# Patient Record
Sex: Male | Born: 1950
Health system: Southern US, Community
[De-identification: ages and names within clinical notes are randomized; demographics above are authoritative.]

## PROBLEM LIST (undated history)

## (undated) DIAGNOSIS — M1990 Unspecified osteoarthritis, unspecified site: Secondary | ICD-10-CM

## (undated) DIAGNOSIS — I10 Essential (primary) hypertension: Secondary | ICD-10-CM

## (undated) DIAGNOSIS — I219 Acute myocardial infarction, unspecified: Secondary | ICD-10-CM

## (undated) HISTORY — PX: COLONOSCOPY: SHX174

---

## 2007-04-27 ENCOUNTER — Encounter: Payer: Self-pay | Admitting: Emergency Medicine

## 2007-04-27 ENCOUNTER — Ambulatory Visit: Payer: Self-pay | Admitting: Internal Medicine

## 2007-04-28 ENCOUNTER — Inpatient Hospital Stay (HOSPITAL_COMMUNITY): Admission: EM | Admit: 2007-04-28 | Discharge: 2007-04-29 | Payer: Self-pay | Admitting: Internal Medicine

## 2007-04-29 ENCOUNTER — Encounter: Payer: Self-pay | Admitting: Internal Medicine

## 2007-05-22 ENCOUNTER — Ambulatory Visit: Payer: Self-pay | Admitting: Cardiology

## 2010-04-04 ENCOUNTER — Other Ambulatory Visit (HOSPITAL_COMMUNITY): Payer: Self-pay | Admitting: Orthopedic Surgery

## 2010-04-04 ENCOUNTER — Ambulatory Visit (HOSPITAL_COMMUNITY)
Admission: RE | Admit: 2010-04-04 | Discharge: 2010-04-04 | Disposition: A | Discharge status: Home / Self Care | Payer: Managed Care, Other (non HMO) | Source: Ambulatory Visit | Attending: Orthopedic Surgery | Admitting: Orthopedic Surgery

## 2010-04-04 ENCOUNTER — Encounter (HOSPITAL_COMMUNITY)
Admission: RE | Admit: 2010-04-04 | Discharge: 2010-04-04 | Disposition: A | Discharge status: Home / Self Care | Payer: Managed Care, Other (non HMO) | Source: Ambulatory Visit | Attending: Orthopedic Surgery | Admitting: Orthopedic Surgery

## 2010-04-04 DIAGNOSIS — Z01810 Encounter for preprocedural cardiovascular examination: Secondary | ICD-10-CM | POA: Insufficient documentation

## 2010-04-04 DIAGNOSIS — Z01818 Encounter for other preprocedural examination: Secondary | ICD-10-CM | POA: Insufficient documentation

## 2010-04-04 DIAGNOSIS — Z01812 Encounter for preprocedural laboratory examination: Secondary | ICD-10-CM | POA: Insufficient documentation

## 2010-04-04 DIAGNOSIS — Z01811 Encounter for preprocedural respiratory examination: Secondary | ICD-10-CM

## 2010-04-04 LAB — DIFFERENTIAL
Basophils Relative: 0 % (ref 0–1)
Monocytes Absolute: 0.9 10*3/uL (ref 0.1–1.0)
Monocytes Relative: 9 % (ref 3–12)
Neutro Abs: 5.7 10*3/uL (ref 1.7–7.7)

## 2010-04-04 LAB — URINALYSIS, ROUTINE W REFLEX MICROSCOPIC
Glucose, UA: NEGATIVE mg/dL
Hgb urine dipstick: NEGATIVE
Ketones, ur: NEGATIVE mg/dL
Protein, ur: NEGATIVE mg/dL

## 2010-04-04 LAB — CBC
Hemoglobin: 15.3 g/dL (ref 13.0–17.0)
MCH: 32.6 pg (ref 26.0–34.0)
MCHC: 34.2 g/dL (ref 30.0–36.0)

## 2010-04-04 LAB — COMPREHENSIVE METABOLIC PANEL
ALT: 51 U/L (ref 0–53)
Albumin: 4.1 g/dL (ref 3.5–5.2)
Alkaline Phosphatase: 50 U/L (ref 39–117)
Calcium: 9.2 mg/dL (ref 8.4–10.5)
Glucose, Bld: 82 mg/dL (ref 70–99)
Potassium: 4.3 mEq/L (ref 3.5–5.1)
Sodium: 141 mEq/L (ref 135–145)
Total Protein: 7.1 g/dL (ref 6.0–8.3)

## 2010-04-04 LAB — APTT: aPTT: 28 seconds (ref 24–37)

## 2010-04-04 LAB — SURGICAL PCR SCREEN: Staphylococcus aureus: NEGATIVE

## 2010-04-04 LAB — PROTIME-INR
INR: 0.91 (ref 0.00–1.49)
Prothrombin Time: 12.5 seconds (ref 11.6–15.2)

## 2010-04-04 LAB — ABO/RH: ABO/RH(D): O POS

## 2010-04-05 LAB — URINE CULTURE: Culture: NO GROWTH

## 2010-04-10 ENCOUNTER — Inpatient Hospital Stay (HOSPITAL_COMMUNITY)
Admission: RE | Admit: 2010-04-10 | Discharge: 2010-04-12 | DRG: 470 | Disposition: A | Discharge status: Home Health | Payer: Managed Care, Other (non HMO) | Source: Ambulatory Visit | Attending: Orthopedic Surgery | Admitting: Orthopedic Surgery

## 2010-04-10 DIAGNOSIS — M1710 Unilateral primary osteoarthritis, unspecified knee: Principal | ICD-10-CM | POA: Diagnosis present

## 2010-04-10 DIAGNOSIS — D62 Acute posthemorrhagic anemia: Secondary | ICD-10-CM | POA: Diagnosis not present

## 2010-04-11 LAB — CROSSMATCH
ABO/RH(D): O POS
Antibody Screen: NEGATIVE
Unit division: 0
Unit division: 0

## 2010-04-11 LAB — CBC
HCT: 37.9 % — ABNORMAL LOW (ref 39.0–52.0)
Hemoglobin: 12.2 g/dL — ABNORMAL LOW (ref 13.0–17.0)
RBC: 3.86 MIL/uL — ABNORMAL LOW (ref 4.22–5.81)

## 2010-04-11 LAB — BASIC METABOLIC PANEL
CO2: 26 mEq/L (ref 19–32)
Chloride: 104 mEq/L (ref 96–112)
GFR calc Af Amer: >60 mL/min (ref >60)
Glucose, Bld: 106 mg/dL — ABNORMAL HIGH (ref 70–99)
Potassium: 4.1 mEq/L (ref 3.5–5.1)
Sodium: 137 mEq/L (ref 135–145)

## 2010-04-12 LAB — BASIC METABOLIC PANEL
CO2: 25 mEq/L (ref 19–32)
Chloride: 106 mEq/L (ref 96–112)
Creatinine, Ser: 1.6 mg/dL — ABNORMAL HIGH (ref 0.4–1.5)
GFR calc Af Amer: 54 mL/min — ABNORMAL LOW (ref >60)
Glucose, Bld: 100 mg/dL — ABNORMAL HIGH (ref 70–99)

## 2010-04-12 LAB — CBC
HCT: 30.7 % — ABNORMAL LOW (ref 39.0–52.0)
Hemoglobin: 9.9 g/dL — ABNORMAL LOW (ref 13.0–17.0)
MCH: 31.6 pg (ref 26.0–34.0)
MCV: 98.1 fL (ref 78.0–100.0)
RBC: 3.13 MIL/uL — ABNORMAL LOW (ref 4.22–5.81)

## 2010-04-25 NOTE — Op Note (Signed)
NAME:  Robert Bailey, Robert Bailey NO.:  [id number]  MEDICAL RECORD NO.:  [id number]           PATIENT TYPE:  I  LOCATION:  5014                         FACILITY:  [facility]  PHYSICIAN:  Mila Homer. Sherlean Foot, M.D. DATE OF BIRTH:  August 06, 1950  DATE OF PROCEDURE:  04/10/2010 DATE OF DISCHARGE:                              OPERATIVE REPORT   SURGEON:  Mila Homer. Sherlean Foot, MD.  ASSISTANT:  Altamese Cabal, PA-C and Skip Mayer, PA-C  ANESTHESIA:  General.  PREOPERATIVE DIAGNOSIS:  Left knee osteoarthritis.  POSTOPERATIVE DIAGNOSIS:  Left knee osteoarthritis.  PROCEDURE:  Left total knee arthroplasty.  INDICATIONS FOR PROCEDURE:  The patient is a 60 year old white male with failure of conservative measures for osteoarthritis of the knee and a history of an old osteochondral defect pinned with a metal tendon. Informed consent obtained.  DESCRIPTION OF PROCEDURE:  The patient was laid supine and administered general anesthesia.  Left leg prepped and draped in usual sterile fashion.  The extremity was exsanguinated with the Esmarch and tourniquet inflated to 350 mmHg and set for an hour.  Made a midline incision with a #10 blade.  Made a new blade to make a medial parapatellar arthrotomy and performed a synovectomy.  I then performed synovectomy with clean blade and elevated deep MCL off the medial crest of the tibia.  I then everted the patella and measured 24 mm thick.  I reamed down 8-1/2 mm and drilled through lug holes through the 35-mm template.  I recreated 24-mm thickness.  I subluxed patella laterally and went into flexion.  I used extramedullary alignment system on the tibia to make perpendicular cut to the anatomic axis of the tibia.  Used the intramedullary alignment system on the femur to make a 6-degree valgus cut.  Marked out the epicondylar angle and measured 5 degrees.  I then sized to size F and pinned to the 5-degree external rotation holes. I then made the  anterior, posterior, and chamfer cuts with sagittal saw. I did run into the middle panel lateral condyle.  I removed this in pieces.  I did use a straight find and got the entire thing out.  I then placed a lamina spreader in the removed the ACL, PCL, medial lateral menisci, and posterior condylar osteophytes.  I then placed a 12-mm spacer block in the obtained excellent flexion/extension gap balance.  I then finished femur with a size E finishing block and finished the tibia with a size 6 tibial tray drilling keel.  I then trialed with a F femur 6 tibia, 12 insert, 35 patella, had good flexion/extension gap balance dropping angle back to 135 degrees.  Stability was excellent.  Patellar tracking was excellent.  I then removed the trial components, copiously irrigated.  I then cemented the components and removed all excess cement and allowed the cement to harden in extension.  I left a Hemovac to come out superolaterally and deep arthrotomy pain catheter coming out supermedially and superficial arthrotomy.  I let tourniquet down, obtained hemostasis and copiously irrigated.  I then closed the arthrotomy figure-of-eight #1 Vicryl sutures, 0 Vicryl sutures subcuticular, 2-0 Vicryl stitches,  and skin staples.  Dressed with Xeroform dressing, sponges, sterile Webril, and TED stocking.  COMPLICATIONS:  None.  DRAINS:  One Hemovac one pain catheter.  ESTIMATED BLOOD LOSS:  300 mL.  TOURNIQUET TIME:  53 minutes.          ______________________________ Mila Homer Sherlean Foot, M.D.     SDL/MEDQ  D:  04/10/2010  T:  04/11/2010  Job:  [id number]  Electronically Signed by Georgena Spurling M.D. on 04/25/2010 02:18:36 PM

## 2010-05-30 NOTE — Assessment & Plan Note (Signed)
[facility]                          [facility] OFFICE NOTE   MERIT, MAYBEE                      MRN:          [id number]  DATE:05/22/2007                            DOB:          04-Jul-1950    CARDIOLOGIST:  Luis Abed, MD, [facility]   PRIMARY CARE PHYSICIAN:  Dr. Sherryll Burger.   REASON FOR VISIT:  Post hospitalization followup.   HISTORY OF PRESENT ILLNESS:  Robert Bailey is a 60 year old male patient  who was transferred from [facility] to [facility] on  April 28, 2007, with complaints of chest pain.  He ruled out for  myocardial infarction by enzymes.  He underwent cardiac catheterization  that revealed minimal nonobstructive disease except for an occluded  distal circumflex before a small posterolateral artery.  His EF was  normal at 65% with normal wall motion.  He did have a confluence of  vessels arising from the circumflex up toward the atria.  There was some  concern for AV fistula.  He underwent an echocardiogram which revealed  good LV function and no significant valvular abnormalities.  The  coronary sinus did not appear to be enlarged.  There was a tiny unusual  diastolic color jet seen that seemed to be at 1 o'clock on the rim of  the aorta which was separate from the small pulmonic regurgitation jet  seen in that same view.  According to the notes this was not felt to be  clinically significant.   The patient notes he is doing well post hospitalization.  He denies any  femoral arteriotomy site pain.  He denies recurrent any chest pain or  shortness breath.  He denies any syncope or near syncope.  Denies any  orthopnea, PND or pedal edema.  He notes that his blood pressure  medications were adjusted during his hospitalization.  Since his blood  pressure has come down he has felt much better.   CURRENT MEDICATIONS:  1. Aspirin 81 mg daily.  2. Exforge 5/320 mg daily.  3. Simvastatin 40 mg daily at bedtime.  4. Nitroglycerin p.r.n. chest pain.  5. Viagra p.r.n.  6. Benadryl p.r.n. sleep.   ALLERGIES:  PENICILLIN causes side effects.   PHYSICAL EXAM:  GENERAL:  He is a well-nourished, well-developed male in  no acute distress.  VITAL SIGNS:  Blood pressure is 120/92, pulse 83, weight 200.6 pounds.  HEENT:  Is normal without JVD.  Carotid pulse +2.  HEART:  Regular rate and rhythm without murmur.  LUNGS:  Clear to auscultation bilaterally.  ABDOMEN:  Soft, nontender.  EXTREMITIES:  Without edema.  Calves soft, nontender.  SKIN:  Warm and dry.  NEUROLOGIC:  He is alert and oriented x3.  Cranial nerves II-XII grossly  intact.  VASCULAR:  Right femoral arteriotomy site without hematoma or bruit.  Carotids without bruits bilaterally.   Electrocardiogram reveals sinus rhythm with heart rate of 85, normal  axis, no acute changes.   IMPRESSION:  1. Coronary artery disease as outlined above.      a.     Distal circumflex occluded before the small  posterolateral       artery.      b.     Circumflex ostial 25%, RCA mid 25%, PDA mid 30%.  2. Good LV function.  3. Dyslipidemia.  4. Hypertension.  5. Noted confluence of vessels arising from the circumflex upward      toward the atria at cardiac catheterization.      a.     A 2-D echocardiogram without enlargement of the coronary       sinus.      b.     Not felt to be clinically significant.   PLAN:  1. [name] is doing well post catheterization.  His blood      pressure looks much better.  He has had no more chest pain.  He is      being treated appropriately for risk factor modification.  2. He will have his lipid and LFTs rechecked in 3 months to follow up      on his lipids.  3. He will follow up in 6 months or sooner p.r.n.  4. The patient did have an abnormal confluence of vessels arising from      the circumflex at his catheterization.  According to the notes it      was not felt to be significant at discharge.  However, in  the      future we will keep this in mind.  If there is a recurrent question      in the future we could always consider a cardiac CT scan to further      evaluate.      Tereso Newcomer, PA-C  Electronically Signed      Luis Abed, MD, [facility]  Electronically Signed   SW/MedQ  DD: 05/22/2007  DT: 05/22/2007  Job #: [id number]   cc:   Kirstie Peri, MD

## 2010-05-30 NOTE — H&P (Signed)
NAME:  Robert Bailey, Robert Bailey NO.:  [id number]   MEDICAL RECORD NO.:  [id number]          PATIENT TYPE:  INP   LOCATION:  2905                         FACILITY:  [facility]   PHYSICIAN:  Bevelyn Buckles. Bensimhon, MDDATE OF BIRTH:  10-Aug-1950   DATE OF ADMISSION:  04/28/2007  DATE OF DISCHARGE:                              HISTORY & PHYSICAL   PRIMARY CARE PHYSICIAN:  Dr. Clelia Croft in [place].   CARDIOLOGY:  He is new to [facility].   REASON FOR ADMISSION:  Unstable angina.   HISTORY OF PRESENT ILLNESS:  Robert Bailey is a very pleasant 60 year old  male with a history of hypertension and tobacco use.  He denies any  history of known coronary artery disease.  He never had a stress test or  cardiac catheterization.  Today, he was at a family gathering when he  developed substernal chest pain and pressure radiating to his arms.  He  had several these episodes throughout the afternoon which became  increasingly worse.  There was no nausea, vomiting or diaphoresis.  Went  to [facility].  He was markedly hypertensive on arrival.  First  set point care markers were negative.  He was treated with aspirin,  nitroglycerin and Lopressor 5 mg x3 and Lovenox.  Pain was much improved  but he was still with very mild pressure about 1/10.   REVIEW OF SYSTEMS:  Completely negative except for HPI and problem list.   PROBLEM LIST:  1. Hypertension.  2. Osteoarthritis status post left knee surgery.   CURRENT MEDICATIONS:  Diovan 320 mg a day.   ALLERGIES:  PENICILLIN.   SOCIAL HISTORY:  He is married.  He works as an Art gallery manager.  [place] half pack cigarettes a day.  No alcohol.  No drugs.   FAMILY HISTORY:  Mother died from Alzheimer's.  Father died at age 57  due to a cerebral aneurysm.  Brother is 31 and is alive with a history  of stroke at 71.   PHYSICAL EXAMINATION:  GENERAL:  He is in no acute distress.  His  respirations are unlabored.  VITAL SIGNS:  Blood pressure is 160/100 with a heart rate of 70,  saturating 100% on 2 liters nasal cannula.  NECK:  Supple.  No JVD.  Carotid 2+ bilaterally without bruits.  There  is no lymphadenopathy or thyromegaly.  CARDIAC:  PMI is nondisplaced.  He has distant heart sounds.  He is  regular with no murmur, rubs or gallops.  LUNGS: Clear.  ABDOMEN: Soft, nontender, nondistended.  There is no hepatosplenomegaly,  no bruits.  No masses.  Good bowel sounds.  EXTREMITIES:  Warm with no cyanosis, clubbing or edema.  DP pulses 2+ in  the left, 1+ on the right.  There is no rash.  NEUROLOGICAL:  Alert and oriented x3.  Cranial nerves II-XII are intact.  Moves all four extremities without difficulty.  Affect is pleasant.   STUDIES:  EKG shows sinus rhythm rate of 84 with no ST-T wave  abnormalities.   LABORATORY DATA:  White count 8.7, hemoglobin  is 14.3, platelet 227.  Sodium 138, potassium 3.6, BUN is 19, creatinine is 0.98.   ASSESSMENT:  1. Chest pain concerning for unstable angina.  2. Hypertensive hypertension, resistant.  3. Tobacco use.  4. Hypokalemia.   PLAN:  Will admit for rule-out myocardial infarction.  We will control  his blood pressure.  Treat him with Lovenox, aspirin, beta-blocker  statin and nitroglycerin.  Not given Plavix in the possibility of  surgical disease.   He has hypertension.  He is a high risk for an aortic dissection and if  cath is negative, we will pursue a CT to rule out dissection  In fact,  if he has ongoing chest pain, may consider doing this even this evening  prior to his cath.  He may also need a smoking cessation consult.      Bevelyn Buckles. Bensimhon, MD  Electronically Signed     DRB/MEDQ  D:  04/28/2007  T:  04/28/2007  Job:  [id number]

## 2010-05-30 NOTE — Discharge Summary (Signed)
NAME:  Robert Bailey, Robert Bailey NO.:  [id number]   MEDICAL RECORD NO.:  [id number]          PATIENT TYPE:  INP   LOCATION:  4713                         FACILITY:  [facility]   PHYSICIAN:  Robert Rotunda, MD, FACCDATE OF BIRTH:  Jun 04, 1950   DATE OF ADMISSION:  04/28/2007  DATE OF DISCHARGE:  04/29/2007                               DISCHARGE SUMMARY   PRIMARY CARE Robert Bailey:  Dr. Sherryll Bailey in [place].   DISCHARGE DIAGNOSIS:  Chest pain.   SECONDARY DIAGNOSES:  1. Coronary artery disease.  2. Hypertension.  3. Hyperlipidemia.  4. Ongoing tobacco abuse.   ALLERGIES:  PENICILLIN.   PROCEDURES:  Left heart cardiac catheterization and 2D echocardiogram.   HISTORY OF PRESENT ILLNESS:  A 60 year old male without prior history of  coronary artery disease.  The patient was in his usual state of health  until April 28, 2007, when while at a family gathering he developed  substernal chest pain and pressure radiating to his arms.  He had  stuttering symptoms throughout the day and presented at [facility] where he was hypertensive on arrival.  First set point-of-care  markers were negative.  He was treated with aspirin, nitroglycerin, beta  blocker, and Lovenox and transferred to [facility] for evaluation.  The  patient was admitted for rule out.   HOSPITAL COURSE:  Cardiac markers have been negative.  He has had no  additional chest discomfort.  He underwent left heart cardiac  catheterization on April 28, 2007, revealing an occluded distal left  circumflex with otherwise nonobstructive coronary artery disease.  EF  was 65% without regional wall motion abnormalities.  It was noted on  catheterization there was a confluence of vessels arising in the  circumflex upward toward the atria.  Because of this, there was question  of an arteriovenous malformation.  The patient underwent a 2D  echocardiogram to further evaluate the coronary sinus and left atrium  this morning, and this  has shown a normal size of the coronary sinus and  left atrium.  EF was also normal.  A tiny - unusual diastolic color  jet was seen in the short axis view of the aortic valve, which was  separate from a small pulmonary regurgitation jet.  This finding was not  clinically significant.  Robert Bailey will be discharged home today in  good condition.   DISCHARGE LABS:  Hemoglobin 12.9, hematocrit 37.4, WBC 7.8, and  platelets 192.  Sodium 138, potassium 3.6, chloride 107, CO2 of 25, BUN  9, creatinine 0.93, glucose 108, total bilirubin 0.4, alkaline  phosphatase 53, AST 26, ALT 40, total protein 6.0, albumin 3.6, calcium  8.5, hemoglobin A1c 5.6, CK 159, MB 1.8, troponin I of 0.01, total  cholesterol 159, triglycerides 138, HDL 24, and LDL 107.  Urine drug  screen was negative.   DISPOSITION:  The patient is being discharged home today in good  condition.   FOLLOWUP PLANS AND APPOINTMENTS:  We have arranged for followup for him  in [place] with Dr. Willa Bailey on May 22, 2007, at 12:30 p.m.  He is asked  to follow up with Dr. Sherryll Bailey as previously scheduled.   DISCHARGE MEDICATIONS:  1. Aspirin 81 mg daily.  2. Diovan 320 mg.  3. Simvastatin 40 mg nightly.  4. Nitroglycerin 0.4 mg sublingual p.r.n. chest pain.  5. Viagra.  6. Benadryl  7. Over-the-counter sleep aids previously taken as needed.   Patient is advised not to use Viagra if using nitrates.   OUTSTANDING LABORATORY STUDIES:  None.   The patient has been counseled the importance of smoke cessation.   DURATION OF DISCHARGE/ENCOUNTER:  Sixty minutes including physician  time.      Robert Bailey, ANP      Robert Rotunda, MD, [facility]  Electronically Signed    CB/MEDQ  D:  04/29/2007  T:  04/30/2007  Job:  [id number]   cc:   Robert Peri, MD

## 2010-05-30 NOTE — Cardiovascular Report (Signed)
NAME:  Robert Bailey, Robert Bailey NO.:  [id number]   MEDICAL RECORD NO.:  [id number]          PATIENT TYPE:  INP   LOCATION:  4713                         FACILITY:  [facility]   PHYSICIAN:  Rollene Rotunda, MD, FACCDATE OF BIRTH:  07-19-1950   DATE OF PROCEDURE:  DATE OF DISCHARGE:                            CARDIAC CATHETERIZATION   PRIMARY:  Kirstie Peri, MD.   CARDIOLOGIST:  Bevelyn Buckles. Bensimhon, MD.   PROCEDURE:  Left heart catheterization/coronary arteriography.   INDICATIONS:  Evaluate the patient with chest pain.   PROCEDURE NOTE:  Left heart catheterization was performed with the right  femoral artery.  The vessel was cannulated using an anterior wall  puncture.  A #6-French arterial sheath was inserted via the modified  Seldinger technique.  Preformed Judkins pigtail catheter was utilized.  The patient tolerated the procedure well and left the lab in stable  condition.   RESULTS:  Hemodynamics, LV 121/6, AV 120/77.  Coronaries, the LAD  wrapped the apex.  It was normal.  The first diagonal which was large  and normal.  Circumflex had an ostial 25% stenosis.  There was an AV  groove with mid diffuse luminal irregularities.  The distal vessel was  occluded before small posterolateral.  First obtuse marginal was  moderate size and normal.  Second obtuse marginal was moderate size and  normal.  The posterolateral x2 was moderate size and normal.  The right  coronary artery was dominant.  There was mid long 25% stenosis.  The PDA  was moderate size with mid 30% stenosis.   Of note, there was a confluence of vessels arising from the circumflex  upward toward the atria.  This was not seen to empty into any particular  structure.  A small question of AV fistula.  Left ventriculogram, the  left ventriculogram was obtained in the area of the projection.  The EF  65% with normal wall motion.  Aortogram, the distal aortogram was  obtained secondary to some difficulty advancing  the guidewire.  In fact,  we had to do many of the exchanges via wire exchange.  However, there  was no obstructive disease noted though the aorta was somewhat  underfilled.  There may have been some mild plaquing upon which the wire  snagged.  The renal arteries appeared to be normal.   CONCLUSION:  Distal circumflex occlusion.  Mild plaque elsewhere.  Unusual confluence of vessels arising from the circumflex toward the  atria.   PLAN:  We will get an echocardiogram to evaluate left atrial coronary  and sinus size.  The patient will need aggressive primary risk  reduction.      Rollene Rotunda, MD, [facility]  Electronically Signed     JH/MEDQ  D:  04/28/2007  T:  04/29/2007  Job:  [id number]   cc:   Kirstie Peri, MD

## 2010-10-10 LAB — COMPREHENSIVE METABOLIC PANEL
ALT: 40
AST: 26
Albumin: 3.6
Alkaline Phosphatase: 53
Potassium: 3.9
Sodium: 136
Total Protein: 6

## 2010-10-10 LAB — CBC
HCT: 37.4 — ABNORMAL LOW
HCT: 38.9 — ABNORMAL LOW
HCT: 41.1
Hemoglobin: 12.9 — ABNORMAL LOW
Hemoglobin: 14.3
MCHC: 34.8
MCV: 93.6
MCV: 93.9
Platelets: 222
Platelets: 227
RBC: 4 — ABNORMAL LOW
RDW: 13.9
WBC: 7.8
WBC: 8.2

## 2010-10-10 LAB — BASIC METABOLIC PANEL
BUN: 19
BUN: 9
CO2: 25
Calcium: 8.7
Chloride: 107
GFR calc Af Amer: >60
Glucose, Bld: 117 — ABNORMAL HIGH
Potassium: 3.6
Sodium: 138
Sodium: 138

## 2010-10-10 LAB — CARDIAC PANEL(CRET KIN+CKTOT+MB+TROPI)
CK, MB: 2.7
Relative Index: 1.1
Relative Index: 1.3

## 2010-10-10 LAB — URINE DRUGS OF ABUSE SCREEN W ALC, ROUTINE (REF LAB)
Barbiturate Quant, Ur: NEGATIVE
Benzodiazepines.: NEGATIVE
Ethyl Alcohol: <5
Phencyclidine (PCP): NEGATIVE

## 2010-10-10 LAB — DIFFERENTIAL
Basophils Absolute: 0
Basophils Relative: 1
Eosinophils Relative: 3
Monocytes Absolute: 0.8
Neutro Abs: 3.9

## 2010-10-10 LAB — LIPID PANEL
LDL Cholesterol: 107 — ABNORMAL HIGH
Triglycerides: 138

## 2010-10-10 LAB — PROTIME-INR: INR: 1

## 2010-10-10 LAB — APTT: aPTT: 33

## 2011-01-16 HISTORY — PX: REPLACEMENT TOTAL KNEE: SUR1224

## 2015-09-10 ENCOUNTER — Emergency Department (HOSPITAL_COMMUNITY): Payer: Self-pay

## 2015-09-10 ENCOUNTER — Inpatient Hospital Stay (HOSPITAL_COMMUNITY)
Admission: EM | Admit: 2015-09-10 | Discharge: 2015-09-17 | DRG: 300 | Disposition: A | Discharge status: Home / Self Care | Payer: Self-pay | Attending: Surgery | Admitting: Surgery

## 2015-09-10 ENCOUNTER — Encounter (HOSPITAL_COMMUNITY): Payer: Self-pay | Admitting: *Deleted

## 2015-09-10 DIAGNOSIS — I7102 Dissection of abdominal aorta: Secondary | ICD-10-CM

## 2015-09-10 DIAGNOSIS — N179 Acute kidney failure, unspecified: Secondary | ICD-10-CM | POA: Diagnosis not present

## 2015-09-10 DIAGNOSIS — I2510 Atherosclerotic heart disease of native coronary artery without angina pectoris: Secondary | ICD-10-CM | POA: Diagnosis present

## 2015-09-10 DIAGNOSIS — Z9114 Patient's other noncompliance with medication regimen: Secondary | ICD-10-CM

## 2015-09-10 DIAGNOSIS — E669 Obesity, unspecified: Secondary | ICD-10-CM | POA: Diagnosis present

## 2015-09-10 DIAGNOSIS — I71019 Dissection of thoracic aorta, unspecified: Secondary | ICD-10-CM | POA: Diagnosis present

## 2015-09-10 DIAGNOSIS — F17210 Nicotine dependence, cigarettes, uncomplicated: Secondary | ICD-10-CM | POA: Diagnosis present

## 2015-09-10 DIAGNOSIS — Z880 Allergy status to penicillin: Secondary | ICD-10-CM

## 2015-09-10 DIAGNOSIS — I959 Hypotension, unspecified: Secondary | ICD-10-CM | POA: Diagnosis present

## 2015-09-10 DIAGNOSIS — Z6834 Body mass index (BMI) 34.0-34.9, adult: Secondary | ICD-10-CM

## 2015-09-10 DIAGNOSIS — Z8249 Family history of ischemic heart disease and other diseases of the circulatory system: Secondary | ICD-10-CM

## 2015-09-10 DIAGNOSIS — I7103 Dissection of thoracoabdominal aorta: Principal | ICD-10-CM | POA: Diagnosis present

## 2015-09-10 DIAGNOSIS — Z96652 Presence of left artificial knee joint: Secondary | ICD-10-CM | POA: Diagnosis present

## 2015-09-10 DIAGNOSIS — I248 Other forms of acute ischemic heart disease: Secondary | ICD-10-CM | POA: Diagnosis not present

## 2015-09-10 DIAGNOSIS — I10 Essential (primary) hypertension: Secondary | ICD-10-CM | POA: Diagnosis present

## 2015-09-10 DIAGNOSIS — I7101 Dissection of thoracic aorta: Secondary | ICD-10-CM | POA: Diagnosis present

## 2015-09-10 HISTORY — DX: Essential (primary) hypertension: I10

## 2015-09-10 HISTORY — DX: Unspecified osteoarthritis, unspecified site: M19.90

## 2015-09-10 LAB — COMPREHENSIVE METABOLIC PANEL
ALBUMIN: 4.2 g/dL (ref 3.5–5.0)
ALK PHOS: 60 U/L (ref 38–126)
ALT: 29 U/L (ref 17–63)
ANION GAP: 10 (ref 5–15)
AST: 26 U/L (ref 15–41)
BUN: 14 mg/dL (ref 6–20)
CALCIUM: 9.5 mg/dL (ref 8.9–10.3)
CO2: 28 mmol/L (ref 22–32)
CREATININE: 1.58 mg/dL — AB (ref 0.61–1.24)
Chloride: 104 mmol/L (ref 101–111)
GFR calc Af Amer: 52 mL/min — ABNORMAL LOW (ref >60)
GFR calc non Af Amer: 45 mL/min — ABNORMAL LOW (ref >60)
GLUCOSE: 132 mg/dL — AB (ref 65–99)
Potassium: 3.5 mmol/L (ref 3.5–5.1)
SODIUM: 142 mmol/L (ref 135–145)
Total Bilirubin: 1 mg/dL (ref 0.3–1.2)
Total Protein: 7.1 g/dL (ref 6.5–8.1)

## 2015-09-10 LAB — CBC WITH DIFFERENTIAL/PLATELET
BASOS ABS: 0 10*3/uL (ref 0.0–0.1)
Basophils Relative: 0 %
Eosinophils Absolute: 0.2 10*3/uL (ref 0.0–0.7)
Eosinophils Relative: 2 %
HCT: 49.9 % (ref 39.0–52.0)
Hemoglobin: 16.5 g/dL (ref 13.0–17.0)
LYMPHS ABS: 3 10*3/uL (ref 0.7–4.0)
LYMPHS PCT: 32 %
MCH: 31.7 pg (ref 26.0–34.0)
MCHC: 33.1 g/dL (ref 30.0–36.0)
MCV: 96 fL (ref 78.0–100.0)
Monocytes Absolute: 0.7 10*3/uL (ref 0.1–1.0)
Monocytes Relative: 7 %
NEUTROS ABS: 5.4 10*3/uL (ref 1.7–7.7)
NEUTROS PCT: 59 %
PLATELETS: 226 10*3/uL (ref 150–400)
RBC: 5.2 MIL/uL (ref 4.22–5.81)
RDW: 13.5 % (ref 11.5–15.5)
WBC: 9.4 10*3/uL (ref 4.0–10.5)

## 2015-09-10 LAB — TYPE AND SCREEN
ABO/RH(D): O POS
ANTIBODY SCREEN: NEGATIVE

## 2015-09-10 LAB — PROTIME-INR
INR: 0.98
Prothrombin Time: 13 seconds (ref 11.4–15.2)

## 2015-09-10 LAB — I-STAT TROPONIN, ED: Troponin i, poc: 0.01 ng/mL (ref 0.00–0.08)

## 2015-09-10 LAB — MRSA PCR SCREENING: MRSA by PCR: NEGATIVE

## 2015-09-10 LAB — I-STAT CHEM 8, ED
BUN: 18 mg/dL (ref 6–20)
CALCIUM ION: 1.12 mmol/L (ref 1.12–1.23)
CHLORIDE: 101 mmol/L (ref 101–111)
Creatinine, Ser: 1.5 mg/dL — ABNORMAL HIGH (ref 0.61–1.24)
Glucose, Bld: 128 mg/dL — ABNORMAL HIGH (ref 65–99)
HCT: 50 % (ref 39.0–52.0)
Hemoglobin: 17 g/dL (ref 13.0–17.0)
Potassium: 3.4 mmol/L — ABNORMAL LOW (ref 3.5–5.1)
SODIUM: 143 mmol/L (ref 135–145)
TCO2: 26 mmol/L (ref 0–100)

## 2015-09-10 LAB — APTT: aPTT: 26 seconds (ref 24–36)

## 2015-09-10 MED ORDER — FENTANYL CITRATE (PF) 100 MCG/2ML IJ SOLN
50.0000 ug | INTRAMUSCULAR | Status: DC | PRN
  Administered 2015-09-10: 50 ug via INTRAVENOUS
  Filled 2015-09-10: qty 2

## 2015-09-10 MED ORDER — HYDROMORPHONE HCL 1 MG/ML IJ SOLN
1.0000 mg | Freq: Once | INTRAMUSCULAR | Status: AC
  Administered 2015-09-10: 1 mg via INTRAVENOUS
  Filled 2015-09-10: qty 1

## 2015-09-10 MED ORDER — SENNA 8.6 MG PO TABS
1.0000 | ORAL_TABLET | Freq: Two times a day (BID) | ORAL | Status: DC
  Administered 2015-09-10 – 2015-09-17 (×11): 8.6 mg via ORAL
  Filled 2015-09-10 (×13): qty 1

## 2015-09-10 MED ORDER — METOCLOPRAMIDE HCL 5 MG/ML IJ SOLN
5.0000 mg | Freq: Four times a day (QID) | INTRAMUSCULAR | Status: DC
  Administered 2015-09-10 – 2015-09-11 (×2): 5 mg via INTRAVENOUS
  Filled 2015-09-10 (×2): qty 2

## 2015-09-10 MED ORDER — NICARDIPINE HCL IN NACL 20-0.86 MG/200ML-% IV SOLN
3.0000 mg/h | Freq: Once | INTRAVENOUS | Status: AC
  Administered 2015-09-10: 3 mg/h via INTRAVENOUS
  Filled 2015-09-10: qty 200

## 2015-09-10 MED ORDER — FENTANYL CITRATE (PF) 100 MCG/2ML IJ SOLN
INTRAMUSCULAR | Status: AC
  Filled 2015-09-10: qty 2

## 2015-09-10 MED ORDER — SODIUM CHLORIDE 0.9% FLUSH: 3.0000 mL | INTRAVENOUS | Status: DC | PRN

## 2015-09-10 MED ORDER — LABETALOL HCL 5 MG/ML IV SOLN
0.5000 mg/min | INTRAVENOUS | Status: DC
  Administered 2015-09-10: 1.5 mg/min via INTRAVENOUS
  Administered 2015-09-10: 0.5 mg/min via INTRAVENOUS
  Administered 2015-09-10 – 2015-09-11 (×3): 1.5 mg/min via INTRAVENOUS
  Administered 2015-09-12: 2 mg/min via INTRAVENOUS
  Filled 2015-09-10 (×7): qty 100

## 2015-09-10 MED ORDER — NITROPRUSSIDE SODIUM 25 MG/ML IV SOLN
0.0000 ug/kg/min | INTRAVENOUS | Status: DC
  Filled 2015-09-10: qty 2

## 2015-09-10 MED ORDER — ONDANSETRON HCL 4 MG/2ML IJ SOLN
4.0000 mg | Freq: Four times a day (QID) | INTRAMUSCULAR | Status: DC | PRN
  Administered 2015-09-10 (×2): 4 mg via INTRAVENOUS
  Filled 2015-09-10 (×2): qty 2

## 2015-09-10 MED ORDER — NITROPRUSSIDE SODIUM 25 MG/ML IV SOLN
0.0000 ug/kg/min | Freq: Once | INTRAVENOUS | Status: AC
  Administered 2015-09-10: 0.3 ug/kg/min via INTRAVENOUS
  Filled 2015-09-10: qty 2

## 2015-09-10 MED ORDER — FENTANYL CITRATE (PF) 100 MCG/2ML IJ SOLN
INTRAMUSCULAR | Status: AC
  Administered 2015-09-10: 100 ug
  Filled 2015-09-10: qty 2

## 2015-09-10 MED ORDER — OXYCODONE HCL 5 MG PO TABS
5.0000 mg | ORAL_TABLET | ORAL | Status: DC | PRN
  Administered 2015-09-12: 5 mg via ORAL
  Filled 2015-09-10: qty 1

## 2015-09-10 MED ORDER — DOCUSATE SODIUM 100 MG PO CAPS
100.0000 mg | ORAL_CAPSULE | Freq: Two times a day (BID) | ORAL | Status: DC
  Administered 2015-09-10 – 2015-09-17 (×12): 100 mg via ORAL
  Filled 2015-09-10 (×13): qty 1

## 2015-09-10 MED ORDER — IOPAMIDOL (ISOVUE-370) INJECTION 76%
INTRAVENOUS | Status: AC
  Administered 2015-09-10: 100 mL
  Filled 2015-09-10: qty 100

## 2015-09-10 MED ORDER — FENTANYL CITRATE (PF) 100 MCG/2ML IJ SOLN
100.0000 ug | Freq: Once | INTRAMUSCULAR | Status: AC
  Administered 2015-09-10: 100 ug via INTRAVENOUS

## 2015-09-10 MED ORDER — SODIUM CHLORIDE 0.9 % IV SOLN: 250.0000 mL | INTRAVENOUS | Status: DC | PRN

## 2015-09-10 MED ORDER — ESMOLOL HCL-SODIUM CHLORIDE 2000 MG/100ML IV SOLN
25.0000 ug/kg/min | INTRAVENOUS | Status: DC
  Administered 2015-09-10: 25 ug/kg/min via INTRAVENOUS
  Filled 2015-09-10: qty 100

## 2015-09-10 MED ORDER — LABETALOL HCL 5 MG/ML IV SOLN
10.0000 mg | Freq: Once | INTRAVENOUS | Status: AC
  Administered 2015-09-10: 10 mg via INTRAVENOUS
  Filled 2015-09-10: qty 4

## 2015-09-10 MED ORDER — ONDANSETRON HCL 4 MG PO TABS: 4.0000 mg | ORAL_TABLET | Freq: Four times a day (QID) | ORAL | Status: DC | PRN

## 2015-09-10 MED ORDER — SODIUM CHLORIDE 0.9% FLUSH
3.0000 mL | Freq: Two times a day (BID) | INTRAVENOUS | Status: DC
  Administered 2015-09-10 – 2015-09-11 (×3): 3 mL via INTRAVENOUS
  Administered 2015-09-12: 11:00:00 via INTRAVENOUS
  Administered 2015-09-12 – 2015-09-17 (×9): 3 mL via INTRAVENOUS

## 2015-09-10 NOTE — ED Notes (Signed)
PT discomfort increasing.  Pressures increasing.

## 2015-09-10 NOTE — Progress Notes (Signed)
Patient ID: Robert Bailey, male   DOB: 1950/11/06, 65 y.o.   MRN: [id number]  SICU Evening Rounds:  BP under good control today on nipride 0.3 mcg and labetalol 2.5 mg/min.  No chest or back pain today but has had some N/V.  Urine output ok.

## 2015-09-10 NOTE — ED Provider Notes (Signed)
[facility] DEPT Provider Note   CSN: [id number] Arrival date & time: 09/10/15  0705     History   Chief Complaint Chief Complaint  Patient presents with  . Back Pain  . Chest Pain    HPI Robert Bailey is a 65 y.o. male.  HPI  65 year old male who presents with chest pain and back pain. History of poorly controlled type of pressure. Woke up at 5:30 AM with severe chest pain and intrascapular pain. Has not had similar pain before. Says he cannot take a deep breath. It was associated with nausea and diaphoresis. EMS was called subsequently and he was brought to the ED for evaluation. He was reportedly initially hypertensive with systolic blood pressure of 180. Received 2 nitroglycerin tablets with brief hypotension, now resolved. Also received a full dose of aspirin.  History reviewed. No pertinent past medical history.  There are no active problems to display for this patient.   History reviewed. No pertinent surgical history.     Home Medications    Prior to Admission medications   Medication Sig Start Date End Date Taking? Authorizing Provider  aspirin 81 MG tablet Take 81 mg by mouth daily.   Yes Historical Provider, MD    Family History No family history on file.  Social History Social History  Substance Use Topics  . Smoking status: Current Every Day Smoker    Packs/day: 1.00  . Smokeless tobacco: Never Used  . Alcohol use Not on file     Allergies   Penicillins   Review of Systems Review of Systems  Unable to perform ROS: Acuity of condition     Physical Exam Updated Vital Signs BP 154/97   Pulse 74   Resp 13   Ht 5\' 11"  (1.803 m)   Wt 250 lb (113.4 kg)   SpO2 95%   BMI 34.87 kg/m   Physical Exam Physical Exam  Nursing note and vitals reviewed. Constitutional:  Appears to be in significant distress secondary to pain, diaphoretic Head: Normocephalic and atraumatic.  Mouth/Throat: Oropharynx is clear and moist.  Neck: Neck supple.   Cardiovascular: Normal rate and regular rhythm.   no edema. +1 bilateral DP pulses with normal capillary refill. +2 radial pulses bilaterally Pulmonary/Chest: Effort normal and breath sounds normal.  Abdominal: Soft. There is no tenderness. There is no rebound and no guarding.  minimal distention.  Musculoskeletal: No deformities  Neurological: Alert, no facial droop, fluent speech, moves all extremities symmetrically Skin: Skin is warm and dry.  Psychiatric: Cooperative   ED Treatments / Results  Labs (all labs ordered are listed, but only abnormal results are displayed) Labs Reviewed  COMPREHENSIVE METABOLIC PANEL - Abnormal; Notable for the following:       Result Value   Glucose, Bld 132 (*)    Creatinine, Ser 1.58 (*)    GFR calc non Af Amer 45 (*)    GFR calc Af Amer 52 (*)    All other components within normal limits  I-STAT CHEM 8, ED - Abnormal; Notable for the following:    Potassium 3.4 (*)    Creatinine, Ser 1.50 (*)    Glucose, Bld 128 (*)    All other components within normal limits  CBC WITH DIFFERENTIAL/PLATELET  PROTIME-INR  APTT  I-STAT TROPOININ, ED  TYPE AND SCREEN    EKG  EKG Interpretation  Date/Time:  Saturday September 10 2015 07:13:46 EDT Ventricular Rate:  67 PR Interval:    QRS Duration: 100 QT Interval:  446 QTC Calculation: 471 R Axis:   85 Text Interpretation:  Unknown rhythm, irregular rate Borderline right axis deviation Nonspecific repol abnormality, diffuse leads Borderline ST elevation, anterior leads Baseline wander in lead(s) V6 Confirmed by Cleopatra Sardo MD, Page Pucciarelli [id number]) on 09/10/2015 7:51:22 AM       Radiology No results found.  Procedures Procedures (including critical care time)  Medications Ordered in ED Medications  fentaNYL (SUBLIMAZE) 100 MCG/2ML injection (not administered)  esmolol (BREVIBLOC) 2000 mg / 100 mL (20 mg/mL) infusion (not administered)  fentaNYL (SUBLIMAZE) 100 MCG/2ML injection (100 mcg  Given 09/10/15 0715)    iopamidol (ISOVUE-370) 76 % injection (100 mLs  Contrast Given 09/10/15 0730)  fentaNYL (SUBLIMAZE) injection 100 mcg (100 mcg Intravenous Given 09/10/15 0736)  nicardipine (CARDENE) 20mg  in 0.86% saline 245ml IV infusion (0.1 mg/ml) (25.5 mg/hr Intravenous Rate/Dose Change 09/10/15 0818)  HYDROmorphone (DILAUDID) injection 1 mg (1 mg Intravenous Given 09/10/15 0743)  labetalol (NORMODYNE,TRANDATE) injection 10 mg (10 mg Intravenous Given 09/10/15 0758)     Initial Impression / Assessment and Plan / ED Course  I have reviewed the triage vital signs and the nursing notes.  Pertinent labs & imaging results that were available during my care of the patient were reviewed by me and considered in my medical decision making (see chart for details).  Clinical Course   History of poorly controlled blood pressure who presents with sudden onset of severe chest pain and back pain. Is extremely hypertensive with systolic blood pressures greater than 200, with blood pressure differential in the right arm 150s. CT angiogram of the chest, abdomen, and pelvis obtained and concerning for type B aortic dissection. Blood pressure control initiated with nicardipine and esmolol infusions with labetalol IV pushes. Discussed with Dr. Cyndia Bent from cardiothoracic surgery. He will be admitted to ICU for ongoing medical management onto the cardiothoracic surgery service.   CRITICAL CARE Performed by: Forde Dandy  ?  Total critical care time: 45 minutes  Critical care time was exclusive of separately billable procedures and treating other patients.  Critical care was necessary to treat or prevent imminent or life-threatening deterioration.  Critical care was time spent personally by me on the following activities: development of treatment plan with patient and/or surrogate as well as nursing, discussions with consultants, evaluation of patient's response to treatment, examination of patient, obtaining history from  patient or surrogate, ordering and performing treatments and interventions, ordering and review of laboratory studies, ordering and review of radiographic studies, pulse oximetry and re-evaluation of patient's condition.   Final Clinical Impressions(s) / ED Diagnoses   Final diagnoses:  Dissection of thoracoabdominal aorta [facility])    New Prescriptions New Prescriptions   No medications on file     Forde Dandy, MD 09/10/15 1057

## 2015-09-10 NOTE — H&P (Signed)
Robert Bailey [address]       [place] [address]             [phone]      Cardiothoracic Surgery Admission History and Physical   Robert Bailey is an 65 y.o. male.   Chief Complaint:  Chest and back pain, acute type B aortic dissection  HPI:   The patient is a 65 year old gentleman with a history of HTN who has been on medications in the past but says that he stopped them because there were no refills and he could not afford to continue them after he lost his job several years ago. Early this am he developed acute severe chest and back pain and woke his wife up at about 5:30 am. He reports some weakness in his legs at that time that has resolved. He was brought to the ED and had an SBP of 180. He was given 2 SL NTG and full dose ASA with brief hypotension that resolved. An ECG showed sinus rhythm with LVH. Troponin I, poc was 0.01. CTA of the chest shows an acute type B aortic dissection beginning just distal to the left subclavian artery and extending to the level of the celiac axis with complete thrombosis of the false lumen. The descending aorta is mildly enlarged at 3.9 cm. There is no compromise of organ vasculature on CT. He is on Nicardipine drip in the ER and systolic pressure is 539'J with resolution of all pain.   PMH:   HTN  Reports having a cardiac cath several years ago that showed no significant disease after he presented with chest pain.  Denies hyperlipidemia and diabetes   PSH:  S/p left TKR   FH:  Strong family history of hypertension   Social History:  reports that he has been smoking.  He has been smoking about 1.00 pack per day. He has never used smokeless tobacco. Denies alcohol and drug use.  Allergies:  Allergies  Allergen Reactions  . Penicillins Shortness Of Breath     (Not in a hospital admission)  Results for orders placed or performed during the hospital encounter of 09/10/15 (from the past 48 hour(s))  CBC with  Differential     Status: None   Collection Time: 09/10/15  7:20 AM  Result Value Ref Range   WBC 9.4 4.0 - 10.5 K/uL   RBC 5.20 4.22 - 5.81 MIL/uL   Hemoglobin 16.5 13.0 - 17.0 g/dL   HCT 49.9 39.0 - 52.0 %   MCV 96.0 78.0 - 100.0 fL   MCH 31.7 26.0 - 34.0 pg   MCHC 33.1 30.0 - 36.0 g/dL   RDW 13.5 11.5 - 15.5 %   Platelets 226 150 - 400 K/uL   Neutrophils Relative % 59 %   Neutro Abs 5.4 1.7 - 7.7 K/uL   Lymphocytes Relative 32 %   Lymphs Abs 3.0 0.7 - 4.0 K/uL   Monocytes Relative 7 %   Monocytes Absolute 0.7 0.1 - 1.0 K/uL   Eosinophils Relative 2 %   Eosinophils Absolute 0.2 0.0 - 0.7 K/uL   Basophils Relative 0 %   Basophils Absolute 0.0 0.0 - 0.1 K/uL  Comprehensive metabolic panel     Status: Abnormal   Collection Time: 09/10/15  7:20 AM  Result Value Ref Range   Sodium 142 135 - 145 mmol/L   Potassium 3.5 3.5 - 5.1 mmol/L   Chloride 104 101 - 111 mmol/L  CO2 28 22 - 32 mmol/L   Glucose, Bld 132 (H) 65 - 99 mg/dL   BUN 14 6 - 20 mg/dL   Creatinine, Ser 1.58 (H) 0.61 - 1.24 mg/dL   Calcium 9.5 8.9 - 10.3 mg/dL   Total Protein 7.1 6.5 - 8.1 g/dL   Albumin 4.2 3.5 - 5.0 g/dL   AST 26 15 - 41 U/L   ALT 29 17 - 63 U/L   Alkaline Phosphatase 60 38 - 126 U/L   Total Bilirubin 1.0 0.3 - 1.2 mg/dL   GFR calc non Af Amer 45 (L) >60 mL/min   GFR calc Af Amer 52 (L) >60 mL/min    Comment: (NOTE) The eGFR has been calculated using the CKD EPI equation. This calculation has not been validated in all clinical situations. eGFR's persistently <60 mL/min signify possible Chronic Kidney Disease.    Anion gap 10 5 - 15  Protime-INR     Status: None   Collection Time: 09/10/15  7:20 AM  Result Value Ref Range   Prothrombin Time 13.0 11.4 - 15.2 seconds   INR 0.98   APTT     Status: None   Collection Time: 09/10/15  7:20 AM  Result Value Ref Range   aPTT 26 24 - 36 seconds  Type and screen [place]     Status: None   Collection Time: 09/10/15  7:20 AM    Result Value Ref Range   ABO/RH(D) O POS    Antibody Screen NEG    Sample Expiration 09/13/2015   I-Stat Troponin, ED (not at [facility])     Status: None   Collection Time: 09/10/15  7:29 AM  Result Value Ref Range   Troponin i, poc 0.01 0.00 - 0.08 ng/mL   Comment 3            Comment: Due to the release kinetics of cTnI, a negative result within the first hours of the onset of symptoms does not rule out myocardial infarction with certainty. If myocardial infarction is still suspected, repeat the test at appropriate intervals.   I-Stat Chem 8, ED     Status: Abnormal   Collection Time: 09/10/15  7:31 AM  Result Value Ref Range   Sodium 143 135 - 145 mmol/L   Potassium 3.4 (L) 3.5 - 5.1 mmol/L   Chloride 101 101 - 111 mmol/L   BUN 18 6 - 20 mg/dL   Creatinine, Ser 1.50 (H) 0.61 - 1.24 mg/dL   Glucose, Bld 128 (H) 65 - 99 mg/dL   Calcium, Ion 1.12 1.12 - 1.23 mmol/L   TCO2 26 0 - 100 mmol/L   Hemoglobin 17.0 13.0 - 17.0 g/dL   HCT 50.0 39.0 - 52.0 %   Ct Angio Chest/abd/pel For Dissection W And/or Wo Contrast  Result Date: 09/10/2015 CLINICAL DATA:  Dissection study - Hypertension, patient complaining of severe anterior chest pain "patient keeps stating something is wrong" He appears grey and diaphoric EXAM: CT ANGIOGRAPHY CHEST, ABDOMEN, PELVIS WITH CONTRAST TECHNIQUE: Multidetector CT imaging of the chest, abdomen, pelvis was performed using the standard protocol during bolus administration of intravenous contrast. Multiplanar CT image reconstructions and MIPs were obtained to evaluate the vascular anatomy. CONTRAST:  100 cc of isovue 370 mg COMPARISON:  11/11/2010 CT abdomen pelvis FINDINGS: CHEST Vascular: Right arm IV contrast administration. The SVC is patent. Right atrium and right ventricle are nondilated. There is only fair contrast opacification of pulmonary artery branches ; the exam was not  optimized for detection of pulmonary emboli. No large central filling defects are  identified. Patent bilateral pulmonary veins drain into the left atrium. No left atrial appendage thrombus. Good contrast opacification of the thoracic aorta. Ascending aorta is unremarkable. Bovine variant brachiocephalic arterial origin anatomy without proximal stenosis. Just distal to the origin of left subclavian artery begins an area of asymmetric eccentric wall thickening which extends down through the descending thoracic aorta terminating at approximately the level of the celiac axis origin. This associated with displaced intimal calcifications. The regional wall thickening is not hyperdense on the noncontrast scan, and there is no evidence of active flow within this process consistent with either thrombosed dissection or intramural hematoma. There is no compromise of the true lumen. There is mild dilatation of the distal arch to 3.9 cm, proximal descending thoracic aorta 3.4 cm, mid descending 3.9 cm, distal descending 3.8 cm, returning to normal caliber at the level of the celiac axis origin ; the visualized distal descending thoracic aorta was normal in caliber on prior exam. Mediastinum/Lymph Nodes: No masses or pathologically enlarged lymph nodes identified. No pericardial effusion. Lungs/Pleura: Mild dependent atelectasis posteriorly in both lower lobes. Patient motion degrades evaluation through portions of the upper lobes. No focal infiltrate or nodule. No pneumothorax. No pleural effusion. Musculoskeletal: Anterior vertebral endplate spurring at multiple levels in the mid and lower thoracic spine. ABDOMEN Arterial Aorta: The type B dissection or intramural hematoma terminates at approximately level of the celiac axis origin without a involvement of this vessel. There is some scattered atheromatous plaque in the infrarenal abdominal aorta. No aneurysm or stenosis. Celiac axis:          Patent Superior mesenteric:  Patent Left renal:           Patent, single, with some motion degradation. Right renal:           Single, patent Inferior mesenteric: Patent, with origin stenosis related to aortic wall plaque. Left iliac: Eccentric plaque in the common iliac without stenosis or aneurysm, otherwise unremarkable. Right iliac: Short-segment origin stenosis of mild severity, of questionable hemodynamic significance. Scattered plaque in the more distal common iliac and external iliac arteries without aneurysm dissection or stenosis. Venous Venous phase imaging not obtained. Review of the MIP images confirms the above findings. Nonvasular Hepatobiliary: No masses or other significant abnormality. Pancreas: No mass, inflammatory changes, or other significant abnormality. Spleen: Within normal limits in size and appearance. Adrenals/Urinary Tract: No masses identified. No evidence of hydronephrosis. Stomach/Bowel: No evidence of obstruction, inflammatory process, or abnormal fluid collections. Lymphatic: some prominent nodes centrally in the mesenteric with regional wispy changes, similar to what was seen on the prior study. No new adenopathy. Reproductive: Mild prostatic enlargement with central coarse calcifications. Other: No ascites. No free air. Umbilical hernia containing only mesenteric fat. Musculoskeletal: Multilevel lumbar spondylitic changes most marked L4-5 and L5-S1. Negative for fracture. IMPRESSION: 1. Thrombosed Stanford B/DeBakey III dissection versus intramural hematoma in the thoracic aorta extending from just beyond the subclavian artery origin to the level of the celiac axis, as detailed above. Critical Value/emergent results were called by telephone at the time of interpretation on 09/10/2015 at 8:30 am to Dr. Brantley Stage , who verbally acknowledged these results. 2. Mild aneurysmal dilatation of the descending thoracic aorta to 3.9 cm. 3. Mild origin stenosis of the right common iliac artery, of possible hemodynamic significance. Correlate with clinical symptomatology and ABIs. Electronically Signed   By: Lucrezia Europe M.D.   On: 09/10/2015 08:32  Review of Systems  Constitutional: Negative for chills, fever, malaise/fatigue and weight loss.  HENT: Negative.   Eyes: Negative.   Respiratory: Negative for cough, sputum production and shortness of breath.   Cardiovascular: Positive for chest pain. Negative for palpitations, orthopnea, claudication, leg swelling and PND.  Gastrointestinal: Positive for nausea. Negative for abdominal pain, blood in stool, melena and vomiting.  Genitourinary: Negative.   Musculoskeletal: Positive for back pain.  Skin: Negative.   Neurological: Negative for dizziness, sensory change and loss of consciousness.       Weakness in legs when pain started that resolved.  Endo/Heme/Allergies: Negative.   Psychiatric/Behavioral: Negative.     Blood pressure 154/97, pulse 74, resp. rate 13, height _0  (1.803 m), weight 104.3 kg (230 lb), SpO2 95 %. Physical Exam  Constitutional: He is oriented to person, place, and time.  Obese gentleman in no distress  HENT:  Head: Normocephalic and atraumatic.  Mouth/Throat: Oropharynx is clear and moist.  Eyes: EOM are normal. Pupils are equal, round, and reactive to light.  Neck: Normal range of motion. Neck supple. No JVD present.  Normal carotid pulses, no bruits  Cardiovascular: Normal rate, regular rhythm, normal heart sounds and intact distal pulses.   No murmur heard. Respiratory: Effort normal and breath sounds normal. No respiratory distress.  GI: Soft. Bowel sounds are normal. He exhibits no distension and no mass. There is no tenderness.  Musculoskeletal: Normal range of motion. He exhibits no edema.  Lymphadenopathy:    He has no cervical adenopathy.  Neurological: He is alert and oriented to person, place, and time. He has normal strength. No cranial nerve deficit or sensory deficit.  Skin: Skin is warm and dry.  Psychiatric: He has a normal mood and affect.    Study Result   CLINICAL DATA:  Dissection study -  Hypertension, patient complaining of severe anterior chest pain "patient keeps stating something is wrong" He appears grey and diaphoric  EXAM: CT ANGIOGRAPHY CHEST, ABDOMEN, PELVIS WITH CONTRAST  TECHNIQUE: Multidetector CT imaging of the chest, abdomen, pelvis was performed using the standard protocol during bolus administration of intravenous contrast. Multiplanar CT image reconstructions and MIPs were obtained to evaluate the vascular anatomy.  CONTRAST:  100 cc of isovue 370 mg  COMPARISON:  11/11/2010 CT abdomen pelvis  FINDINGS: CHEST  Vascular: Right arm IV contrast administration. The SVC is patent. Right atrium and right ventricle are nondilated. There is only fair contrast opacification of pulmonary artery branches ; the exam was not optimized for detection of pulmonary emboli. No large central filling defects are identified. Patent bilateral pulmonary veins drain into the left atrium. No left atrial appendage thrombus. Good contrast opacification of the thoracic aorta. Ascending aorta is unremarkable. Bovine variant brachiocephalic arterial origin anatomy without proximal stenosis.  Just distal to the origin of left subclavian artery begins an area of asymmetric eccentric wall thickening which extends down through the descending thoracic aorta terminating at approximately the level of the celiac axis origin. This associated with displaced intimal calcifications. The regional wall thickening is not hyperdense on the noncontrast scan, and there is no evidence of active flow within this process consistent with either thrombosed dissection or intramural hematoma. There is no compromise of the true lumen. There is mild dilatation of the distal arch to 3.9 cm, proximal descending thoracic aorta 3.4 cm, mid descending 3.9 cm, distal descending 3.8 cm, returning to normal caliber at the level of the celiac axis origin ; the visualized distal descending thoracic  aorta was normal in caliber on prior exam.  Mediastinum/Lymph Nodes: No masses or pathologically enlarged lymph nodes identified. No pericardial effusion.  Lungs/Pleura: Mild dependent atelectasis posteriorly in both lower lobes. Patient motion degrades evaluation through portions of the upper lobes. No focal infiltrate or nodule. No pneumothorax. No pleural effusion.  Musculoskeletal: Anterior vertebral endplate spurring at multiple levels in the mid and lower thoracic spine.  ABDOMEN  Arterial  Aorta: The type B dissection or intramural hematoma terminates at approximately level of the celiac axis origin without a involvement of this vessel. There is some scattered atheromatous plaque in the infrarenal abdominal aorta. No aneurysm or stenosis.  Celiac axis:          Patent  Superior mesenteric:  Patent  Left renal:           Patent, single, with some motion degradation.  Right renal:          Single, patent  Inferior mesenteric: Patent, with origin stenosis related to aortic wall plaque.  Left iliac: Eccentric plaque in the common iliac without stenosis or aneurysm, otherwise unremarkable.  Right iliac: Short-segment origin stenosis of mild severity, of questionable hemodynamic significance. Scattered plaque in the more distal common iliac and external iliac arteries without aneurysm dissection or stenosis.  Venous  Venous phase imaging not obtained.  Review of the MIP images confirms the above findings.  Nonvasular  Hepatobiliary: No masses or other significant abnormality.  Pancreas: No mass, inflammatory changes, or other significant abnormality.  Spleen: Within normal limits in size and appearance.  Adrenals/Urinary Tract: No masses identified. No evidence of hydronephrosis.  Stomach/Bowel: No evidence of obstruction, inflammatory process, or abnormal fluid collections.  Lymphatic: some prominent nodes centrally in the  mesenteric with regional wispy changes, similar to what was seen on the prior study. No new adenopathy.  Reproductive: Mild prostatic enlargement with central coarse calcifications.  Other: No ascites. No free air. Umbilical hernia containing only mesenteric fat.  Musculoskeletal: Multilevel lumbar spondylitic changes most marked L4-5 and L5-S1. Negative for fracture.  IMPRESSION: 1. Thrombosed Stanford B/DeBakey III dissection versus intramural hematoma in the thoracic aorta extending from just beyond the subclavian artery origin to the level of the celiac axis, as detailed above. Critical Value/emergent results were called by telephone at the time of interpretation on 09/10/2015 at 8:30 am to Dr. Crista Curb , who verbally acknowledged these results. 2. Mild aneurysmal dilatation of the descending thoracic aorta to 3.9 cm. 3. Mild origin stenosis of the right common iliac artery, of possible hemodynamic significance. Correlate with clinical symptomatology and ABIs.   Electronically Signed   By: Corlis Leak M.D.   On: 09/10/2015 08:32    Assessment/Plan  This gentleman has a history of untreated hypertension due to financial difficulty affording medications and presents with uncontrolled severe hypertension and acute type B aortic dissection with complete thrombosis of the false lumen. He had initial weakness in his legs that resolved that could have been due to some alteration in spinal cord blood flow with thrombosis of the false lumen or may have just been due to the pain but he is neurologically intact now. His pain has resolved in the ER although his BP is still too high. He will be admitted to the SICU for observation and BP control with labetalol and nipride. I reviewed the disease process, treatment plans and goals, possible short term and long term complications including the significant risk of death with the patient and his wife and son at  the bedside. They seem to  understand and agree with the treatment plan. I stressed the importance of risk factor reduction including smoking cessation, good long term BP control with medications, dietary changes, exercise and weight control. Will check his Hgb A1c and cholesterol profiles.  Gaye Pollack, MD 09/10/2015, 8:46 AM

## 2015-09-10 NOTE — ED Triage Notes (Signed)
Patient came in today by [facility] with c/o severe back pain and chest pain. Patient woke up with it this morning. Hx of smoking and HTN. BP is high.

## 2015-09-10 NOTE — ED Notes (Addendum)
Cardio thoracic surgeon, Dr Cyndia Bent, at bedside.

## 2015-09-10 NOTE — ED Notes (Signed)
Family at bedside. 

## 2015-09-11 LAB — COMPREHENSIVE METABOLIC PANEL
ALT: 22 U/L (ref 17–63)
ANION GAP: 7 (ref 5–15)
AST: 20 U/L (ref 15–41)
Albumin: 3.8 g/dL (ref 3.5–5.0)
Alkaline Phosphatase: 41 U/L (ref 38–126)
BUN: 20 mg/dL (ref 6–20)
CHLORIDE: 104 mmol/L (ref 101–111)
CO2: 27 mmol/L (ref 22–32)
Calcium: 9 mg/dL (ref 8.9–10.3)
Creatinine, Ser: 1.42 mg/dL — ABNORMAL HIGH (ref 0.61–1.24)
GFR, EST AFRICAN AMERICAN: 59 mL/min — AB (ref >60)
GFR, EST NON AFRICAN AMERICAN: 51 mL/min — AB (ref >60)
Glucose, Bld: 129 mg/dL — ABNORMAL HIGH (ref 65–99)
Potassium: 3.6 mmol/L (ref 3.5–5.1)
SODIUM: 138 mmol/L (ref 135–145)
Total Bilirubin: 1.2 mg/dL (ref 0.3–1.2)
Total Protein: 6.4 g/dL — ABNORMAL LOW (ref 6.5–8.1)

## 2015-09-11 LAB — CBC
HCT: 44.4 % (ref 39.0–52.0)
HEMOGLOBIN: 14.3 g/dL (ref 13.0–17.0)
MCH: 31.2 pg (ref 26.0–34.0)
MCHC: 32.2 g/dL (ref 30.0–36.0)
MCV: 96.7 fL (ref 78.0–100.0)
PLATELETS: 140 10*3/uL — AB (ref 150–400)
RBC: 4.59 MIL/uL (ref 4.22–5.81)
RDW: 13.9 % (ref 11.5–15.5)
WBC: 12.7 10*3/uL — AB (ref 4.0–10.5)

## 2015-09-11 MED ORDER — METOPROLOL TARTRATE 50 MG PO TABS
50.0000 mg | ORAL_TABLET | Freq: Two times a day (BID) | ORAL | Status: DC
  Administered 2015-09-11 – 2015-09-14 (×8): 50 mg via ORAL
  Filled 2015-09-11 (×8): qty 1

## 2015-09-11 NOTE — Progress Notes (Signed)
  Subjective:  No chest or back pain. No further nausea.  Objective: Vital signs in last 24 hours: Temp:  [97.3 F (36.3 C)-98.7 F (37.1 C)] 98.7 F (37.1 C) (08/27 0740) Pulse Rate:  [50-84] 72 (08/27 0930) Cardiac Rhythm: Normal sinus rhythm (08/27 0930) Resp:  [11-29] 23 (08/27 0830) BP: (81-177)/(52-113) 93/59 (08/27 0930) SpO2:  [92 %-99 %] 94 % (08/27 0930) Weight:  [104.3 kg (229 lb 15 oz)] 104.3 kg (229 lb 15 oz) (08/26 1000)  Hemodynamic parameters for last 24 hours:    Intake/Output from previous day: 08/26 0701 - 08/27 0700 In: 657.2 [I.V.:657.2] Out: 800 [Urine:800] Intake/Output this shift: Total I/O In: 67.5 [I.V.:67.5] Out: -   General appearance: alert and cooperative Neurologic: intact Heart: regular rate and rhythm, S1, S2 normal, no murmur, click, rub or gallop Lungs: clear to auscultation bilaterally Abdomen: soft, non-tender; bowel sounds normal; no masses,  no organomegaly Extremities: extremities normal, atraumatic, no cyanosis or edema   Lab Results:  Recent Labs  09/10/15 0720 09/10/15 0731 09/11/15 0234  WBC 9.4  --  12.7*  HGB 16.5 17.0 14.3  HCT 49.9 50.0 44.4  PLT 226  --  140*   BMET:  Recent Labs  09/10/15 0720 09/10/15 0731 09/11/15 0234  NA 142 143 138  K 3.5 3.4* 3.6  CL 104 101 104  CO2 28  --  27  GLUCOSE 132* 128* 129*  BUN 14 18 20   CREATININE 1.58* 1.50* 1.42*  CALCIUM 9.5  --  9.0    PT/INR:  Recent Labs  09/10/15 0720  LABPROT 13.0  INR 0.98   ABG    Component Value Date/Time   TCO2 26 09/10/2015 0731   CBG (last 3)  No results for input(s): GLUCAP in the last 72 hours.  Assessment/Plan:  Acute type B aortic dissection without complications  His BP is under good control on labetalol. Nipride is off. Will transition to Lopressor today. He will not be able to afford po Labetalol. Follow up creat tomorrow. He may need an ACE I in addition to beta blocker if creat stable.  OOB to chair and  mobilize.      LOS: 1 day    Gaye Pollack 09/11/2015

## 2015-09-11 NOTE — Progress Notes (Signed)
Patient ID: Robert Bailey, male   DOB: [birth year], 65 y.o.   MRN: [id number]  SICU Evening Rounds:  BP under good control today on Lopressor. Now off labetalol.  Some difficulty with ambulation today due to balance problems. Unclear if this is related to aortic dissection or just deconditioning and bed rest. Will consult PT.

## 2015-09-12 DIAGNOSIS — I7102 Dissection of abdominal aorta: Secondary | ICD-10-CM

## 2015-09-12 LAB — BASIC METABOLIC PANEL
Anion gap: 10 (ref 5–15)
BUN: 25 mg/dL — AB (ref 6–20)
CHLORIDE: 101 mmol/L (ref 101–111)
CO2: 25 mmol/L (ref 22–32)
Calcium: 9.1 mg/dL (ref 8.9–10.3)
Creatinine, Ser: 1.44 mg/dL — ABNORMAL HIGH (ref 0.61–1.24)
GFR calc Af Amer: 58 mL/min — ABNORMAL LOW (ref >60)
GFR calc non Af Amer: 50 mL/min — ABNORMAL LOW (ref >60)
GLUCOSE: 119 mg/dL — AB (ref 65–99)
POTASSIUM: 3.3 mmol/L — AB (ref 3.5–5.1)
Sodium: 136 mmol/L (ref 135–145)

## 2015-09-12 LAB — HEMOGLOBIN A1C
Hgb A1c MFr Bld: 5.5 % (ref 4.8–5.6)
Mean Plasma Glucose: 111 mg/dL

## 2015-09-12 MED ORDER — POTASSIUM CHLORIDE CRYS ER 20 MEQ PO TBCR
40.0000 meq | EXTENDED_RELEASE_TABLET | Freq: Two times a day (BID) | ORAL | Status: AC
  Administered 2015-09-12 (×2): 40 meq via ORAL
  Filled 2015-09-12 (×2): qty 2

## 2015-09-12 NOTE — Progress Notes (Signed)
Patient refuses to use call bell/inform staff when he wants to ambulate. Patient stated, "To hell with you... To hell with the call bell." Chair and bed alarm are on. Will continue to educate and monitor patient.   Sydnee Cabal. Lovena Le RN

## 2015-09-12 NOTE — Progress Notes (Signed)
Patient ID: Robert Bailey, male   DOB: 1950/07/27, 65 y.o.   MRN: [id number] EVENING ROUNDS NOTE :     [address]       [place] [address]             [phone]                     Total Length of Stay:  LOS: 2 days  BP (!) 156/80   Pulse 83   Temp 99.3 F (37.4 C) (Oral)   Resp 18   Ht 5\' 11"  (1.803 m)   Wt 229 lb 15 oz (104.3 kg)   SpO2 96%   BMI 32.07 kg/m   .Intake/Output      08/27 0701 - 08/28 0700 08/28 0701 - 08/29 0700   P.O. 450 360   I.V. (mL/kg) 285.4 (2.7) 3 (0)   Other 10    Total Intake(mL/kg) 745.4 (7.1) 363 (3.5)   Urine (mL/kg/hr) 1050 (0.4) 425 (0.4)   Total Output 1050 425   Net -304.6 -62        Urine Occurrence  1 x         Lab Results  Component Value Date   WBC 12.7 (H) 09/11/2015   HGB 14.3 09/11/2015   HCT 44.4 09/11/2015   PLT 140 (L) 09/11/2015   GLUCOSE 119 (H) 09/12/2015   CHOL  04/28/2007    159        ATP III CLASSIFICATION:  <200     mg/dL   Desirable  200-239  mg/dL   Borderline High  >=240    mg/dL   High   TRIG 138 04/28/2007   HDL 24 (L) 04/28/2007   LDLCALC (H) 04/28/2007    107        Total Cholesterol/HDL:CHD Risk Coronary Heart Disease Risk Table                     Men   Women  1/2 Average Risk   3.4   3.3   ALT 22 09/11/2015   AST 20 09/11/2015   NA 136 09/12/2015   K 3.3 (L) 09/12/2015   CL 101 09/12/2015   CREATININE 1.44 (H) 09/12/2015   BUN 25 (H) 09/12/2015   CO2 25 09/12/2015   INR 0.98 09/10/2015   HGBA1C 5.5 09/11/2015   Patient not cooperative with care , pulled off wires and leads   Grace Isaac MD  [place] Office [phone] 09/12/2015 6:32 PM

## 2015-09-12 NOTE — Progress Notes (Signed)
  Subjective:  Only complaint is uncomfortable bed. No chest or back pain  Objective: Vital signs in last 24 hours: Temp:  [98.2 F (36.8 C)-99.7 F (37.6 C)] 98.5 F (36.9 C) (08/28 0400) Pulse Rate:  [60-78] 70 (08/28 0700) Cardiac Rhythm: Normal sinus rhythm (08/28 0600) Resp:  [11-26] 22 (08/28 0700) BP: (69-150)/(44-92) 111/60 (08/28 0700) SpO2:  [92 %-98 %] 96 % (08/28 0700)  Hemodynamic parameters for last 24 hours:    Intake/Output from previous day: 08/27 0701 - 08/28 0700 In: 745.4 [P.O.:450; I.V.:285.4] Out: 1050 [Urine:1050] Intake/Output this shift: No intake/output data recorded.  General appearance: alert and cooperative Neurologic: intact Heart: regular rate and rhythm, S1, S2 normal, no murmur, click, rub or gallop Lungs: clear to auscultation bilaterally Abdomen: soft, non-tender; bowel sounds normal; no masses,  no organomegaly Extremities: extremities normal, atraumatic, no cyanosis or edema  Radial and pedal pulses strong bilat  Lab Results:  Recent Labs  09/10/15 0720 09/10/15 0731 09/11/15 0234  WBC 9.4  --  12.7*  HGB 16.5 17.0 14.3  HCT 49.9 50.0 44.4  PLT 226  --  140*   BMET:  Recent Labs  09/11/15 0234 09/12/15 0558  NA 138 136  K 3.6 3.3*  CL 104 101  CO2 27 25  GLUCOSE 129* 119*  BUN 20 25*  CREATININE 1.42* 1.44*  CALCIUM 9.0 9.1    PT/INR:  Recent Labs  09/10/15 0720  LABPROT 13.0  INR 0.98   ABG    Component Value Date/Time   TCO2 26 09/10/2015 0731   CBG (last 3)  No results for input(s): GLUCAP in the last 72 hours.  Assessment/Plan:  BP under good control on Lopressor. Will try to keep SBP less than 140 now. He will probably need an ACE I if creat remains stable but will hold off today.   See how he gets up this am and if he can ambulate will move to 2W. PT consult requested.   LOS: 2 days    Gaye Pollack 09/12/2015

## 2015-09-12 NOTE — Plan of Care (Signed)
Problem: Activity: Goal: Risk for activity intolerance will decrease Outcome: Not Progressing Pt got dizzy upon standing to walk to bathroom and promptly had to sit down. Morning walk postponed.

## 2015-09-12 NOTE — Progress Notes (Addendum)
Pt given new floor bed and bathed. All personal needs met. Denies pain and pleased with setup. Educated on call-bell and says he will try to remember to use it. Bed alarm on.  Will continue to educate and monitor patient.

## 2015-09-12 NOTE — Evaluation (Signed)
Physical Therapy Evaluation Patient Details Name: Robert Bailey MRN: [id number] DOB: [birth year] Today's Date: 09/12/2015   History of Present Illness  65 yo admitted with back and stomach pain with aortic dissection PMHx: HTN, Lt TKA, smoker  Clinical Impression  Pt pleasant and moving well although noted deficit with balance and cognition. Pt reports fatigue unaware of day of week or when he was admitted. Pt with decreased functional mobility and high level balance who will benefit from acute therapy to maximize mobility, independence and balance to return to PLOF.   BP 131/62 before , 138/80 after gait HR 60-70 sats 98% on RA    Follow Up Recommendations No PT follow up    Equipment Recommendations  None recommended by PT    Recommendations for Other Services       Precautions / Restrictions Precautions Precautions: Fall Precaution Comments: sBP <140      Mobility  Bed Mobility Overal bed mobility: Needs Assistance Bed Mobility: Supine to Sit     Supine to sit: Min assist     General bed mobility comments: pt unable to elevate trunk on his own and required physical assist to rise from surface with cues for sequence  Transfers Overall transfer level: Needs assistance   Transfers: Sit to/from Stand Sit to Stand: Min guard         General transfer comment: guarding for safety as pt with periodic sway to right  Ambulation/Gait Ambulation/Gait assistance: Min guard Ambulation Distance (Feet): 600 Feet Assistive device: None Gait Pattern/deviations: Drifts right/left;Step-through pattern;Decreased stride length   Gait velocity interpretation: Below normal speed for age/gender General Gait Details: pt easily distracted by environment, tends to stay near guard rail on right as well as drift to right, guarding for safety and balance  Stairs            Wheelchair Mobility    Modified Rankin (Stroke Patients Only)       Balance Overall balance  assessment: Needs assistance   Sitting balance-Leahy Scale: Normal       Standing balance-Leahy Scale: Good                               Pertinent Vitals/Pain Pain Assessment: No/denies pain    Home Living Family/patient expects to be discharged to:: Private residence Living Arrangements: Spouse/significant other Available Help at Discharge: Family;Available PRN/intermittently Type of Home: House Home Access: Stairs to enter Entrance Stairs-Rails: None Entrance Stairs-Number of Steps: 4, 4" steps Home Layout: One level Home Equipment: Walker - 2 wheels;Cane - single point      Prior Function Level of Independence: Independent               Hand Dominance        Extremity/Trunk Assessment   Upper Extremity Assessment: Overall WFL for tasks assessed           Lower Extremity Assessment: Overall WFL for tasks assessed      Cervical / Trunk Assessment: Kyphotic  Communication   Communication: No difficulties  Cognition Arousal/Alertness: Awake/alert Behavior During Therapy: WFL for tasks assessed/performed Overall Cognitive Status: Impaired/Different from baseline Area of Impairment: Memory;Orientation Orientation Level: Time   Memory: Decreased short-term memory              General Comments      Exercises        Assessment/Plan    PT Assessment Patient needs continued PT services  PT Diagnosis  Abnormality of gait   PT Problem List Decreased activity tolerance;Decreased balance;Decreased cognition  PT Treatment Interventions Gait training;Stair training;Functional mobility training;Balance training;Patient/family education;Therapeutic activities   PT Goals (Current goals can be found in the Care Plan section) Acute Rehab PT Goals Patient Stated Goal: return to home and yard work PT Goal Formulation: With patient Time For Goal Achievement: 09/19/15 Potential to Achieve Goals: Good    Frequency Min 3X/week   Barriers  to discharge        Co-evaluation               End of Session   Activity Tolerance: Patient tolerated treatment well Patient left: in chair;with call bell/phone within reach;with chair alarm set Nurse Communication: Mobility status         Time: [id number] PT Time Calculation (min) (ACUTE ONLY): 23 min   Charges:   PT Evaluation $PT Eval Moderate Complexity: 1 Procedure     PT G CodesMelford Aase 09/12/2015, 12:46 PM Elwyn Reach, [place]

## 2015-09-13 ENCOUNTER — Encounter (HOSPITAL_COMMUNITY): Payer: Self-pay | Admitting: Student

## 2015-09-13 DIAGNOSIS — I10 Essential (primary) hypertension: Secondary | ICD-10-CM

## 2015-09-13 DIAGNOSIS — R0789 Other chest pain: Secondary | ICD-10-CM

## 2015-09-13 DIAGNOSIS — Z720 Tobacco use: Secondary | ICD-10-CM

## 2015-09-13 DIAGNOSIS — I7101 Dissection of thoracic aorta: Secondary | ICD-10-CM

## 2015-09-13 LAB — TROPONIN I
TROPONIN I: 0.05 ng/mL — AB (ref <0.03)
Troponin I: 0.05 ng/mL (ref <0.03)

## 2015-09-13 LAB — BASIC METABOLIC PANEL
Anion gap: 6 (ref 5–15)
BUN: 21 mg/dL — AB (ref 6–20)
CHLORIDE: 106 mmol/L (ref 101–111)
CO2: 25 mmol/L (ref 22–32)
CREATININE: 1.2 mg/dL (ref 0.61–1.24)
Calcium: 9 mg/dL (ref 8.9–10.3)
Glucose, Bld: 112 mg/dL — ABNORMAL HIGH (ref 65–99)
Potassium: 4 mmol/L (ref 3.5–5.1)
SODIUM: 137 mmol/L (ref 135–145)

## 2015-09-13 MED ORDER — MORPHINE SULFATE (PF) 2 MG/ML IV SOLN
INTRAVENOUS | Status: AC
  Filled 2015-09-13: qty 1

## 2015-09-13 MED ORDER — LABETALOL HCL 5 MG/ML IV SOLN
0.5000 mg/min | INTRAVENOUS | Status: DC
  Administered 2015-09-13: 2.5 mg/min via INTRAVENOUS
  Administered 2015-09-13: 0.2 mg/min via INTRAVENOUS
  Administered 2015-09-13 – 2015-09-14 (×2): 1 mg/min via INTRAVENOUS
  Filled 2015-09-13 (×5): qty 100

## 2015-09-13 MED ORDER — MORPHINE SULFATE (PF) 2 MG/ML IV SOLN
2.0000 mg | Freq: Once | INTRAVENOUS | Status: AC
  Administered 2015-09-13: 2 mg via INTRAVENOUS

## 2015-09-13 MED ORDER — LISINOPRIL 20 MG PO TABS
20.0000 mg | ORAL_TABLET | Freq: Every day | ORAL | Status: DC
  Administered 2015-09-13: 20 mg via ORAL
  Filled 2015-09-13: qty 1

## 2015-09-13 NOTE — Progress Notes (Signed)
CRITICAL VALUE ALERT  Critical value received:  Troponin I        0.05  Date of notification:  09/13/15  Time of notification:  [id number]  Critical value read back:yes  Nurse who received alert:  Beckie Salts  MD notified (1st page):  Bartle  Time of first page:  1420  MD notified (2nd page):  Time of second page:  Responding MD:  Cyndia Bent  Time MD responded:  [id number]

## 2015-09-13 NOTE — Plan of Care (Signed)
Problem: Safety: Goal: Ability to remain free from injury will improve Outcome: Progressing Pt still very impulsive, trying to get out of bed and sometimes pulling at lines and tubing. Bed and chair alarm in use.  Problem: Pain Managment: Goal: General experience of comfort will improve Outcome: Completed/Met Date Met: 09/13/15 Patient has no complaints of pain the last 12 hours.

## 2015-09-13 NOTE — Progress Notes (Signed)
  Subjective:  No complaints this am. He was put back on labetolol drip last night due to hypertension after being off it all day yesterday. No chest or back pain.  Objective: Vital signs in last 24 hours: Temp:  [98.3 F (36.8 C)-99.3 F (37.4 C)] 98.3 F (36.8 C) (08/29 0725) Pulse Rate:  [55-83] 78 (08/29 0000) Cardiac Rhythm: Normal sinus rhythm (08/29 0600) Resp:  [11-29] 25 (08/29 0700) BP: (101-178)/(60-109) 123/82 (08/29 0700) SpO2:  [93 %-100 %] 95 % (08/29 0400)  Hemodynamic parameters for last 24 hours:    Intake/Output from previous day: 08/28 0701 - 08/29 0700 In: 549.5 [P.O.:360; I.V.:189.5] Out: 650 [Urine:650] Intake/Output this shift: No intake/output data recorded.  General appearance: alert and cooperative Neurologic: intact Heart: regular rate and rhythm, S1, S2 normal, no murmur, click, rub or gallop Lungs: clear to auscultation bilaterally Abdomen: soft, non-tender; bowel sounds normal; no masses,  no organomegaly Extremities: extremities normal, atraumatic, no cyanosis or edema  Femoral and pedal pulses palpable bilat and strong  Lab Results:  Recent Labs  09/11/15 0234  WBC 12.7*  HGB 14.3  HCT 44.4  PLT 140*   BMET:  Recent Labs  09/12/15 0558 09/13/15 0200  NA 136 137  K 3.3* 4.0  CL 101 106  CO2 25 25  GLUCOSE 119* 112*  BUN 25* 21*  CREATININE 1.44* 1.20  CALCIUM 9.1 9.0    PT/INR: No results for input(s): LABPROT, INR in the last 72 hours. ABG    Component Value Date/Time   TCO2 26 09/10/2015 0731   CBG (last 3)  No results for input(s): GLUCAP in the last 72 hours.  Assessment/Plan:  Uncomplicated acute type B aortic dissection due to uncontrolled HTN. His creat is down so will add Lisinopril to Lopressor and wean off Labetalol.   Continue mobilization, IS.   LOS: 3 days    Gaye Pollack 09/13/2015

## 2015-09-13 NOTE — Progress Notes (Signed)
[facility] BRIEF SICU PROGRESS NOTE  Stable day  Plan: Continue current plan  Rexene Alberts, MD 09/13/2015 5:24 PM

## 2015-09-13 NOTE — Care Management Note (Signed)
Case Management Note  Patient Details  Name: Robert Bailey MRN: [id number] Date of Birth: December 17, 1950  Subjective/Objective:   CM met with pt and wife @ bedside.  Pt has no health insurance, wife works but cannot afford health insurance through her job.  Pt was concerned he would not be able to afford medication - advised him that lisinopril and metoprolol are both on [facility] generic list and each script will cost $4.00 for 30-day supply or $10 for 90-day supply.  Pt relieved that meds will be affordable.                   Expected Discharge Plan:  Home/Self Care  Discharge planning Services  CM Consult  Status of Service:  In process, will continue to follow  Girard Cooter, RN 09/13/2015, 2:07 PM

## 2015-09-13 NOTE — Progress Notes (Addendum)
Dr. Servando Snare notified of BP up to 160s while in bed with eyes closed. Pt fidgety and restless in bed, as he has been tonight and last night, however denies any pain and has no PRNs for BP ordered. Verbal orders received to restart labetalol drip to maintain BP < 140 per Dr. Vivi Martens previous note. Will initiate orders and continue to monitor patient.

## 2015-09-13 NOTE — Consult Note (Signed)
Cardiology Consult    Patient ID: Robert Bailey MRN: [id number], DOB/AGE: 03-02-1950   Admit date: 09/10/2015 Date of Consult: 09/13/2015  Primary Physician: No primary care provider on file. Reason for Consult: Chest Pain Primary Cardiologist: Previously Dr. Ron Parker in 2009 Requesting Provider: Dr. Cyndia Bent   History of Present Illness    Robert Bailey is a 65 y.o. male with past medical history of CAD, HTN, tobacco use, and arthritis who presented to [facility] ED on 09/10/2015 for evaluation of sudden onset back and chest pain.   Patient reports he woke up that morning with severe pain in his chest and left scapular area, saying he felt as if he could not take a deep breath. SBP was elevated into the 180's upon arrival to the ED. 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 Mild aneurysmal dilatation of the descending thoracic aorta to 3.9 cm and mild origin stenosis of the right common iliac artery, of possible hemodynamic significance was noted as well. Labs showed a creatinine of 1.58. WBC 9.4, Hgb 16.5, and platelets 226.  He was admitted by CT Surgery and initially placed on a Nipride infusion along with Esmolol. These have since been discontinued and he was switched to a Labetalol drip. He has been started on Lopressor 50mg  BID.   Yesterday, he was agitated and frustrated with staff, removing leads and refusing to use the call bell. SBP became elevated into the 160's and his Labetalol drip was restarted, currently going at 0.6mg /min. Current PO medications include Lisinopril 20mg  daily and Lopressor 50mg  BID.   Cardiology is asked to see the patient for chest pain. The patient reports he had one episode of shooting chest pain along his left pectoral region earlier this morning. Says it lasted for only a few seconds then resolved. Not  as intense as his pain on admission. No associated nausea, vomiting, dyspnea, or diaphoresis noted.    EKG was obtained and showed NSR, HR 67, LVH with repol abnormality in anterior leads, TWI in V5 and V6 (no prior tracings available for comparison). Cyclic troponin values are being obtained and the initial one is minimally elevated to 0.05.   His last ischemic evaluation was a cardiac catheterization in 2009 which showed 25% ostial LCx with distal vessel occluded (confluence of vessels not appearing to empty into any structure), normal LAD, OM1, and OM2. RCA with 25% stenosis and PDA with 30% stenosis. Echocardiogram showed EF of 60% with no wall motion abnormalities.   The patient denies any recent anginal symptoms prior to admission. Says he is active in mowing the yard and trimming weeds. Also has recently built a workshop for him and his son.   Reports smoking 1.0 ppd for over 40 years, saying he is now motivated to stop. Denies any excessive alcohol use or recreational drug use.  Past Medical History   Past Medical History:  Diagnosis Date  . Arthritis   . Hypertension     Past Surgical History:  Procedure Laterality Date  . REPLACEMENT TOTAL KNEE Left 2013     Allergies  Allergies  Allergen Reactions  . Penicillins Shortness Of Breath    Inpatient Medications    . docusate sodium  100 mg Oral BID  . lisinopril  20 mg Oral Daily  . metoprolol tartrate  50 mg Oral BID  . senna  1 tablet Oral BID  . sodium chloride flush  3 mL Intravenous Q12H    Family History    Family History  Problem Relation Age of Onset  . Hypertension Mother     Social History    Social History   Social History  . Marital status: Married    Spouse name: Robert Bailey  . Number of children: 2  . Years of education: N/A   Occupational History  . retired    Social History Main Topics  . Smoking status: Current Every Day Smoker    Packs/day: 1.00    Types: Cigarettes  . Smokeless  tobacco: Never Used  . Alcohol use No  . Drug use: No  . Sexual activity: Not on file   Other Topics Concern  . Not on file   Social History Narrative  . No narrative on file     Review of Systems    General:  No chills, fever, night sweats or weight changes.  Cardiovascular:  No dyspnea on exertion, edema, orthopnea, palpitations, paroxysmal nocturnal dyspnea. Positive for chest pain and back pain.  Dermatological: No rash, lesions/masses Respiratory: No cough, dyspnea Urologic: No hematuria, dysuria Abdominal:   No nausea, vomiting, diarrhea, bright red blood per rectum, melena, or hematemesis Neurologic:  No visual changes, wkns, changes in mental status. All other systems reviewed and are otherwise negative except as noted above.  Physical Exam    Blood pressure 125/78, pulse 78, temperature 98.9 F (37.2 C), temperature source Oral, resp. rate (!) 24, height 5\' 11"  (1.803 m), weight 229 lb 15 oz (104.3 kg), SpO2 95 %.  General: Pleasant, Caucasian male appearing in NAD Psych: Normal affect. Neuro: Alert and oriented X 3. Moves all extremities spontaneously. HEENT: Normal  Neck: Supple without bruits or JVD. Lungs:  Resp regular and unlabored, CTA without wheezing or rales. Heart: RRR no s3, s4, or murmurs. Abdomen: Soft, non-tender, non-distended, BS + x 4.  Extremities: No clubbing, cyanosis or edema. DP/PT/Radials 2+ and equal bilaterally.  Labs    Troponin (Point of Care Test) No results for input(s): TROPIPOC in the last 72 hours.  Recent Labs  09/13/15 1312  TROPONINI 0.05*   Lab Results  Component Value Date   WBC 12.7 (H) 09/11/2015   HGB 14.3 09/11/2015   HCT 44.4 09/11/2015   MCV 96.7 09/11/2015   PLT 140 (L) 09/11/2015    Recent Labs Lab 09/11/15 0234  09/13/15 0200  NA 138  < > 137  K 3.6  < > 4.0  CL 104  < > 106  CO2 27  < > 25  BUN 20  < > 21*  CREATININE 1.42*  < > 1.20  CALCIUM 9.0  < > 9.0  PROT 6.4*  --   --   BILITOT 1.2  --    --   ALKPHOS 41  --   --   ALT 22  --   --   AST 20  --   --   GLUCOSE 129*  < > 112*  < > = values in this interval not displayed.   Radiology Studies    Ct Angio Chest/abd/pel For Dissection W And/or Wo Contrast  Result Date: 09/10/2015 CLINICAL DATA:  Dissection study - Hypertension, patient complaining of severe anterior chest pain "patient keeps stating something is wrong" He appears grey and diaphoric EXAM: CT ANGIOGRAPHY CHEST, ABDOMEN, PELVIS WITH CONTRAST TECHNIQUE: Multidetector CT imaging of the chest, abdomen, pelvis was performed using the standard  protocol during bolus administration of intravenous contrast. Multiplanar CT image reconstructions and MIPs were obtained to evaluate the vascular anatomy. CONTRAST:  100 cc of isovue 370 mg COMPARISON:  11/11/2010 CT abdomen pelvis FINDINGS: CHEST Vascular: Right arm IV contrast administration. The SVC is patent. Right atrium and right ventricle are nondilated. There is only fair contrast opacification of pulmonary artery branches ; the exam was not optimized for detection of pulmonary emboli. No large central filling defects are identified. Patent bilateral pulmonary veins drain into the left atrium. No left atrial appendage thrombus. Good contrast opacification of the thoracic aorta. Ascending aorta is unremarkable. Bovine variant brachiocephalic arterial origin anatomy without proximal stenosis. Just distal to the origin of left subclavian artery begins an area of asymmetric eccentric wall thickening which extends down through the descending thoracic aorta terminating at approximately the level of the celiac axis origin. This associated with displaced intimal calcifications. The regional wall thickening is not hyperdense on the noncontrast scan, and there is no evidence of active flow within this process consistent with either thrombosed dissection or intramural hematoma. There is no compromise of the true lumen. There is mild dilatation of the  distal arch to 3.9 cm, proximal descending thoracic aorta 3.4 cm, mid descending 3.9 cm, distal descending 3.8 cm, returning to normal caliber at the level of the celiac axis origin ; the visualized distal descending thoracic aorta was normal in caliber on prior exam. Mediastinum/Lymph Nodes: No masses or pathologically enlarged lymph nodes identified. No pericardial effusion. Lungs/Pleura: Mild dependent atelectasis posteriorly in both lower lobes. Patient motion degrades evaluation through portions of the upper lobes. No focal infiltrate or nodule. No pneumothorax. No pleural effusion. Musculoskeletal: Anterior vertebral endplate spurring at multiple levels in the mid and lower thoracic spine. ABDOMEN Arterial Aorta: The type B dissection or intramural hematoma terminates at approximately level of the celiac axis origin without a involvement of this vessel. There is some scattered atheromatous plaque in the infrarenal abdominal aorta. No aneurysm or stenosis. Celiac axis:          Patent Superior mesenteric:  Patent Left renal:           Patent, single, with some motion degradation. Right renal:          Single, patent Inferior mesenteric: Patent, with origin stenosis related to aortic wall plaque. Left iliac: Eccentric plaque in the common iliac without stenosis or aneurysm, otherwise unremarkable. Right iliac: Short-segment origin stenosis of mild severity, of questionable hemodynamic significance. Scattered plaque in the more distal common iliac and external iliac arteries without aneurysm dissection or stenosis. Venous Venous phase imaging not obtained. Review of the MIP images confirms the above findings. Nonvasular Hepatobiliary: No masses or other significant abnormality. Pancreas: No mass, inflammatory changes, or other significant abnormality. Spleen: Within normal limits in size and appearance. Adrenals/Urinary Tract: No masses identified. No evidence of hydronephrosis. Stomach/Bowel: No evidence of  obstruction, inflammatory process, or abnormal fluid collections. Lymphatic: some prominent nodes centrally in the mesenteric with regional wispy changes, similar to what was seen on the prior study. No new adenopathy. Reproductive: Mild prostatic enlargement with central coarse calcifications. Other: No ascites. No free air. Umbilical hernia containing only mesenteric fat. Musculoskeletal: Multilevel lumbar spondylitic changes most marked L4-5 and L5-S1. Negative for fracture. IMPRESSION: 1. Thrombosed Stanford B/DeBakey III dissection versus intramural hematoma in the thoracic aorta extending from just beyond the subclavian artery origin to the level of the celiac axis, as detailed above. Critical Value/emergent results were called by  telephone at the time of interpretation on 09/10/2015 at 8:30 am to Dr. Brantley Stage , who verbally acknowledged these results. 2. Mild aneurysmal dilatation of the descending thoracic aorta to 3.9 cm. 3. Mild origin stenosis of the right common iliac artery, of possible hemodynamic significance. Correlate with clinical symptomatology and ABIs. Electronically Signed   By: Lucrezia Europe M.D.   On: 09/10/2015 08:32    EKG & Cardiac Imaging    EKG: NSR, HR 67, LVH with repol abnormality in anterior leads, TWI in V5 and V6 (no prior tracings available for comparison).   Cardiac Catheterization: 04/2007 RESULTS:  Hemodynamics, LV 121/6, AV 120/77.  Coronaries, the LAD  wrapped the apex.  It was normal.  The first diagonal which was large  and normal.  Circumflex had an ostial 25% stenosis.  There was an AV  groove with mid diffuse luminal irregularities.  The distal vessel was  occluded before small posterolateral.  First obtuse marginal was  moderate size and normal.  Second obtuse marginal was moderate size and  normal.  The posterolateral x2 was moderate size and normal.  The right  coronary artery was dominant.  There was mid long 25% stenosis.  The PDA  was moderate size with  mid 30% stenosis.   Of note, there was a confluence of vessels arising from the circumflex  upward toward the atria.  This was not seen to empty into any particular  structure.  A small question of AV fistula.  Left ventriculogram, the  left ventriculogram was obtained in the area of the projection.  The EF  65% with normal wall motion.  Aortogram, the distal aortogram was  obtained secondary to some difficulty advancing the guidewire.  In fact,  we had to do many of the exchanges via wire exchange.  However, there  was no obstructive disease noted though the aorta was somewhat  underfilled.  There may have been some mild plaquing upon which the wire  snagged.  The renal arteries appeared to be normal.   CONCLUSION:  Distal circumflex occlusion.  Mild plaque elsewhere.  Unusual confluence of vessels arising from the circumflex toward the  atria.   PLAN:  We will get an echocardiogram to evaluate left atrial coronary  and sinus size.  The patient will need aggressive primary risk  reduction.   Echocardiogram: 04/2007 SUMMARY - Overall left ventricular systolic function was normal. Left    ventricular ejection fraction was estimated to be 60 %. There    were no left ventricular regional wall motion abnormalities. - Normal mitral valve - The coronary sinus does not appear to be enlarged. There is a    tiny unusual diastolic color jet seen. When looking at the    short axis of the aortic valve, this jet seems to be at 1    O'Clock on the rim of the aorta. It is separate from the    small PI jet seen in this view.  IMPRESSIONS - The coronary sinus does not appear to be enlarged. There is a    tiny unusual diastolic color jet seen. When looking at the    short axis of the aortic valve, this jet seems to be at 1    O'Clock on the rim of the aorta. It is separate from the    small PI jet seen in this view.  Assessment & Plan    1.  Atypical Chest Pain - patient reports having one episode of chest discomfort this  AM which lasted for a few seconds this resolved. Denies any associated symptoms and less intense than his initial presenting symptoms. Denies any exertional chest discomfort or dyspnea prior to admission. Performs yard work without any symptoms.  - last ischemic evaluation was a cardiac catheterization in 2009 which showed 25% ostial LCx with distal vessel occluded (confluence of vessels not appearing to empty into any structure), normal LAD, OM1 and OM2, RCA with 25% stenosis and PDA with 30% stenosis.  - EKG today shows NSR, HR 67, LVH with repol abnormality in anterior leads, TWI in V5 and V6 (no prior tracings available for comparison but similar to one on admission). Cyclic troponin values are being obtained and the initial one is minimally elevated to 0.05, likely secondary to demand ischemia in the setting of Type B aortic dissection and accelerated HTN. - continue to cycle values. Will obtain echocardiogram to assess LV function and wall motion.   2. Accelerated HTN - SBP initially in the 180's upon arrival to the ED. Currently on Labetalol drip at 0.6mg /min, PO Lisinopril 20mg  daily and PO Lopressor 50mg  BID.  - BP currently in the 120's/70's. Continue current medication regimen.   3. Type B Aortic Dissection - CTA on admission showed a thrombosed Stanford B/DeBakey III dissection versus intramural hematoma in the thoracic aorta extending from just beyond the subclavian artery origin to the level of the celiac axis. Mild aneurysmal dilatation of the descending thoracic aorta to 3.9 cm and mild origin stenosis of the right common iliac artery, of possible hemodynamic significance was noted as well.  - per CT Surgery.   4. AKI - creatinine elevated top 1.58 on admission, improved to 1.20 this AM.   5. Tobacco Use - cessation advised. Patient says he is determined to quit.   Signed, Erma Heritage,  PA-C 09/13/2015, 3:33 PM Pager: [phone] Patient seen and examined and history reviewed. Agree with above findings and plan. 65 yo WM with history of uncontrolled HTN, tobacco abuse, CAD presents with acute scapular and back pain. CT positive for type B aortic dissection with intramural hematoma. No compromise of side branches. BP controlled with beta blocker and ACEi.  This am patient complained of chest pain. He states it only lasted a few seconds but nursing notes it lasted 20 minutes. Pain now resolved. On exam he is normotensive. Pulse 74 NSR Lungs clear. CV RRR without gallop, murmur or rub. No edema. Peripheral pulses 2+.  Neuro somnolent but intact.  Ecg shows LVH with repolarization abnormality.  Initial troponin 0.05.   Impression.  1. Type B aortic dissection with intramural hematoma.  2. Chest pain with atypical features. 3. HTN 4. Tobacco abuse 5. CAD- mild by cath in 2009  Plan: optimize BP control as you are doing. Will cycle troponin levels. Check serial Ecg. Will check Echo. Counseled on smoking cessation and he is ready to quit. We will follow.  Zandria Woldt [place], [place] 09/13/2015 3:52 PM

## 2015-09-14 ENCOUNTER — Other Ambulatory Visit (HOSPITAL_COMMUNITY): Payer: Managed Care, Other (non HMO)

## 2015-09-14 ENCOUNTER — Inpatient Hospital Stay (HOSPITAL_COMMUNITY): Payer: Self-pay

## 2015-09-14 DIAGNOSIS — I7100 Dissection of unspecified site of aorta: Secondary | ICD-10-CM

## 2015-09-14 LAB — BASIC METABOLIC PANEL
ANION GAP: 9 (ref 5–15)
BUN: 18 mg/dL (ref 6–20)
CALCIUM: 9 mg/dL (ref 8.9–10.3)
CO2: 23 mmol/L (ref 22–32)
Chloride: 107 mmol/L (ref 101–111)
Creatinine, Ser: 1.2 mg/dL (ref 0.61–1.24)
Glucose, Bld: 104 mg/dL — ABNORMAL HIGH (ref 65–99)
Potassium: 4.1 mmol/L (ref 3.5–5.1)
SODIUM: 139 mmol/L (ref 135–145)

## 2015-09-14 LAB — ECHOCARDIOGRAM COMPLETE
HEIGHTINCHES: 71 in
Weight: 3679.04 oz

## 2015-09-14 LAB — TROPONIN I: Troponin I: 0.05 ng/mL (ref <0.03)

## 2015-09-14 MED ORDER — HYDRALAZINE HCL 20 MG/ML IJ SOLN
10.0000 mg | Freq: Four times a day (QID) | INTRAMUSCULAR | Status: DC | PRN
  Administered 2015-09-15 – 2015-09-17 (×3): 10 mg via INTRAVENOUS
  Filled 2015-09-14 (×3): qty 1

## 2015-09-14 MED ORDER — LISINOPRIL 40 MG PO TABS
40.0000 mg | ORAL_TABLET | Freq: Every day | ORAL | Status: DC
  Administered 2015-09-14 – 2015-09-17 (×4): 40 mg via ORAL
  Filled 2015-09-14: qty 1
  Filled 2015-09-14: qty 2
  Filled 2015-09-14 (×2): qty 1

## 2015-09-14 MED ORDER — ASPIRIN EC 81 MG PO TBEC
81.0000 mg | DELAYED_RELEASE_TABLET | Freq: Every day | ORAL | Status: DC
  Administered 2015-09-14 – 2015-09-17 (×4): 81 mg via ORAL
  Filled 2015-09-14 (×4): qty 1

## 2015-09-14 NOTE — Progress Notes (Addendum)
Physical Therapy Treatment Patient Details Name: Robert Bailey MRN: [id number] DOB: 1950/07/31 Today's Date: 09/14/2015    History of Present Illness 65 yo admitted with back and stomach pain with aortic dissection PMHx: HTN, Lt TKA, smoker    PT Comments    Patient progressing with gait and balance this session.  Seems still to have memory deficits, but functional in terms of discussing options for discounts with advisor from financial office.  Feel continued skilled PT in the acute setting will aide in safe d/c home.   Follow Up Recommendations  No PT follow up     Equipment Recommendations  None recommended by PT    Recommendations for Other Services       Precautions / Restrictions Precautions Precautions: Fall Restrictions Weight Bearing Restrictions: No    Mobility  Bed Mobility   Bed Mobility: Supine to Sit     Supine to sit: Supervision     General bed mobility comments: assist for telemetry lines  Transfers   Equipment used: None Transfers: Sit to/from Stand Sit to Stand: Min guard         General transfer comment: assist for safety  Ambulation/Gait Ambulation/Gait assistance: Min guard Ambulation Distance (Feet): 600 Feet Assistive device: None Gait Pattern/deviations: Step-through pattern;Decreased stride length;Drifts right/left     General Gait Details: tends to hug the rail on R side during first lap around unit, then with cues able to walk more in center of hallway without veering right, stopped several times to stretch low back at counter   Stairs            Wheelchair Mobility    Modified Rankin (Stroke Patients Only)       Balance           Standing balance support: During functional activity;No upper extremity supported Standing balance-Leahy Scale: Good Standing balance comment: standing with distant intermittent supervision in bathroom to toilet and to wash himself up at sink, 3:1 behind him for safety or if  needed to sit to rest, tasks performed over about 6 minutes                    Cognition Arousal/Alertness: Awake/alert Behavior During Therapy: WFL for tasks assessed/performed Overall Cognitive Status: Impaired/Different from baseline Area of Impairment: Memory     Memory: Decreased short-term memory         General Comments: telling me about his brother-in-law that visited him this morning, but forgot the reason he was telliing me    Exercises      General Comments        Pertinent Vitals/Pain Pain Assessment: Faces Faces Pain Scale: Hurts little more Pain Location: L hip Pain Descriptors / Indicators: Tightness;Discomfort Pain Intervention(s): Monitored during session;Repositioned  Vitals pre ambulation: HR 71, RR 17 BP 132/91 Post-ambulation: HR 75, RR 20, BP 119/68    Home Living                      Prior Function            PT Goals (current goals can now be found in the care plan section) Progress towards PT goals: Progressing toward goals    Frequency  Min 3X/week    PT Plan Current plan remains appropriate    Co-evaluation             End of Session Equipment Utilized During Treatment: Gait belt Activity Tolerance: Patient tolerated treatment well Patient left: in  chair;with call bell/phone within reach;with chair alarm set     Time: 0940-1010 PT Time Calculation (min) (ACUTE ONLY): 30 min  Charges:  $Gait Training: 8-22 mins $Therapeutic Activity: 8-22 mins                    G Codes:      Reginia Naas 2015/09/18, 10:34 AM  Magda Kiel, [place] 2015-09-18

## 2015-09-14 NOTE — Progress Notes (Signed)
TELEMETRY: Reviewed telemetry pt in NSR: Vitals:   09/14/15 0700 09/14/15 0800 09/14/15 0900 09/14/15 1000  BP: 135/80 140/84 139/82 (!) 125/104  Pulse: 72     Resp: 19 19 19 19   Temp: 98.2 F (36.8 C)     TempSrc:      SpO2:  97%    Weight:      Height:        Intake/Output Summary (Last 24 hours) at 09/14/15 1143 Last data filed at 09/14/15 0931  Gross per 24 hour  Intake          1331.25 ml  Output              850 ml  Net           481.25 ml   Filed Weights   09/10/15 0740 09/10/15 0844 09/10/15 1000  Weight: 250 lb (113.4 kg) 230 lb (104.3 kg) 229 lb 15 oz (104.3 kg)    Subjective  Denies any pain or SOB.  Marland Kitchen docusate sodium  100 mg Oral BID  . lisinopril  40 mg Oral Daily  . metoprolol tartrate  50 mg Oral BID  . senna  1 tablet Oral BID  . sodium chloride flush  3 mL Intravenous Q12H      LABS: Basic Metabolic Panel:  Recent Labs  09/13/15 0200 09/14/15 0228  NA 137 139  K 4.0 4.1  CL 106 107  CO2 25 23  GLUCOSE 112* 104*  BUN 21* 18  CREATININE 1.20 1.20  CALCIUM 9.0 9.0   Liver Function Tests: No results for input(s): AST, ALT, ALKPHOS, BILITOT, PROT, ALBUMIN in the last 72 hours. No results for input(s): LIPASE, AMYLASE in the last 72 hours. CBC: No results for input(s): WBC, NEUTROABS, HGB, HCT, MCV, PLT in the last 72 hours. Cardiac Enzymes:  Recent Labs  09/13/15 1312 09/13/15 2029 09/14/15 0228  TROPONINI 0.05* 0.05* 0.05*   BNP: No results for input(s): PROBNP in the last 72 hours. D-Dimer: No results for input(s): DDIMER in the last 72 hours. Hemoglobin A1C: No results for input(s): HGBA1C in the last 72 hours. Fasting Lipid Panel: No results for input(s): CHOL, HDL, LDLCALC, TRIG, CHOLHDL, LDLDIRECT in the last 72 hours. Thyroid Function Tests: No results for input(s): TSH, T4TOTAL, T3FREE, THYROIDAB in the last 72 hours.  Invalid input(s): FREET3   Radiology/Studies:  No results found.   Ecg today shows NSR with  LVH. No acute change.  PHYSICAL EXAM General: Well developed, well nourished, in no acute distress. Head: Normocephalic, atraumatic, sclera non-icteric, oropharynx is clear Neck: Negative for carotid bruits. JVD not elevated. No adenopathy Lungs: Clear bilaterally to auscultation without wheezes, rales, or rhonchi. Breathing is unlabored. Heart: RRR S1 S2 without murmurs, rubs, or gallops.  Abdomen: Soft, non-tender, non-distended with normoactive bowel sounds. No hepatomegaly. No rebound/guarding. No obvious abdominal masses. Msk:  Strength and tone appears normal for age. Extremities: No clubbing, cyanosis or edema.  Distal pedal pulses are 2+ and equal bilaterally. Neuro: Alert and oriented X 3. Moves all extremities spontaneously. Psych:  Responds to questions appropriately with a normal affect.  ASSESSMENT AND PLAN: 1. Atypical chest pain. Resolved. No active Ecg changes. Troponin minimally elevated with flat curve. This is not consistent with ACS. Echo pending. 2. Type B aortic dissection with hematoma.  3. HTN. BP improved. Off IV labetalol. 4. Tobacco abuse. 5. AKI  Present on Admission: . Aortic dissection distal to left subclavian (HCC)   Signed, Collier Salina  [place], [place] 09/14/2015 11:43 AM

## 2015-09-14 NOTE — Progress Notes (Signed)
  Echocardiogram 2D Echocardiogram has been performed.  Robert Bailey 09/14/2015, 12:42 PM

## 2015-09-14 NOTE — Progress Notes (Signed)
  Subjective:  Feels better, no chest or back pain overnight or this am.  Objective: Vital signs in last 24 hours: Temp:  [98 F (36.7 C)-98.9 F (37.2 C)] 98.2 F (36.8 C) (08/30 0700) Pulse Rate:  [65-74] 72 (08/30 0700) Cardiac Rhythm: Normal sinus rhythm (08/30 0800) Resp:  [11-25] 21 (08/30 1100) BP: (101-167)/(56-104) 112/56 (08/30 1100) SpO2:  [94 %-98 %] 97 % (08/30 0800)  Hemodynamic parameters for last 24 hours:    Intake/Output from previous day: 08/29 0701 - 08/30 0700 In: [id number] [P.O.:1320; I.V.:342] Out: 850 [Urine:850] Intake/Output this shift: Total I/O In: 29.3 [I.V.:29.3] Out: -   General appearance: alert and cooperative Neurologic: intact Heart: regular rate and rhythm, S1, S2 normal, no murmur, click, rub or gallop Lungs: clear to auscultation bilaterally Abdomen: soft, non-tender; bowel sounds normal; no masses,  no organomegaly Extremities: extremities normal, atraumatic, no cyanosis or edema  Lab Results: No results for input(s): WBC, HGB, HCT, PLT in the last 72 hours. BMET:  Recent Labs  09/13/15 0200 09/14/15 0228  NA 137 139  K 4.0 4.1  CL 106 107  CO2 25 23  GLUCOSE 112* 104*  BUN 21* 18  CREATININE 1.20 1.20  CALCIUM 9.0 9.0    PT/INR: No results for input(s): LABPROT, INR in the last 72 hours. ABG    Component Value Date/Time   TCO2 26 09/10/2015 0731   CBG (last 3)  No results for input(s): GLUCAP in the last 72 hours.  Assessment/Plan:  Acute type B aortic dissection with clotted false lumen. He developed some hypertension overnight and Labetalol drip restarted briefly. Will increased Lisinopril to 40 mg daily and continue Lopressor 50 bid. Stop Labetalol.   He had some chest pain yesterday and mildly positive troponin. Seen by cardiology and felt to be demand ischemia from hypertension. He has had no further chest pain. Echo ordered.  He is ambulating well with PT. Will transfer to 2W and continue mobilization.   LOS: 4 days    Gaye Pollack 09/14/2015

## 2015-09-15 DIAGNOSIS — I7102 Dissection of abdominal aorta: Secondary | ICD-10-CM

## 2015-09-15 MED ORDER — AMLODIPINE BESYLATE 5 MG PO TABS: 5.0000 mg | ORAL_TABLET | Freq: Every day | ORAL | Status: DC

## 2015-09-15 MED ORDER — METOPROLOL TARTRATE 50 MG PO TABS
75.0000 mg | ORAL_TABLET | Freq: Two times a day (BID) | ORAL | Status: DC
  Administered 2015-09-15 – 2015-09-17 (×5): 75 mg via ORAL
  Filled 2015-09-15 (×5): qty 1

## 2015-09-15 NOTE — Discharge Summary (Signed)
Physician Discharge Summary       [address]       [place] [address]             [phone]    Patient ID: Robert Bailey MRN: [id number] DOB/AGE: Sep 16, 1950 65 y.o.  Admit date: 09/10/2015 Discharge date: 09/17/2015  Admission Diagnoses: Aortic dissection (Type B) distal to left subclavian [facility])  Active Diagnoses:  Accelerated hypertension   Consults: cardiology  History of Presenting Illness: The patient is a 65 year old gentleman with a history of HTN who has been on medications in the past but says that he stopped them because there were no refills and he could not afford to continue them after he lost his job several years ago. Early this am he developed acute severe chest and back pain and woke his wife up at about 5:30 am. He reports some weakness in his legs at that time that has resolved. He was brought to the ED and had an SBP of 180. He was given 2 SL NTG and full dose ASA with brief hypotension that resolved. An ECG showed sinus rhythm with LVH. Troponin I, poc was 0.01. CTA of the chest shows an acute type B aortic dissection beginning just distal to the left subclavian artery and extending to the level of the celiac axis with complete thrombosis of the false lumen. The descending aorta is mildly enlarged at 3.9 cm. There is no compromise of organ vasculature on CT. He is on Nicardipine drip in the ER and systolic pressure is 63 with resolution of all pain.  This gentleman has a history of untreated hypertension due to financial difficulty affording medications and presents with uncontrolled severe hypertension and acute type B aortic dissection with complete thrombosis of the false lumen. He had initial weakness in his legs that resolved that could have been due to some alteration in spinal cord blood flow with thrombosis of the false lumen or may have just been due to the pain but he is neurologically intact now. His pain has resolved in the ER although  his BP is still too high. He will be admitted to the SICU for observation and BP control with labetalol and nipride.   Brief Hospital Course:  He remained in the ICU for several days while he was on Labetalol and Nipride drips. He was started on Lopressor and Lisinopril, which were titrated accordingly. His systolic blood pressure is still elevated and he will require a third medication. He was put on Clonidine 0.1 mg bid. He did have complaints of minor back pain (not same pain as he was admitted with) and thought it was the bed.  He is ambulating on room air. He is tolerating a diet. His SBP was still in the 150's so I increased his Clonidine to 0.2 mg bid. Systolic BP currently in the 63's. He has not had further chest pain or shortness of breath.    Latest Vital Signs: Blood pressure (!) 146/83, pulse 66, temperature 97.8 F (36.6 C), temperature source Oral, resp. rate 20, height 5\' 11"  (1.803 m), weight 197 lb 9.6 oz (89.6 kg), SpO2 100 %.  Physical Exam: Cardiovascular: RRR Pulmonary: Clear to auscultation bilaterally Abdomen: Soft, non tender, bowel sounds present. Extremities:No lower extremity edema and warm.   Discharge Condition:Stable and discharged to home.  Recent laboratory studies:  Lab Results  Component Value Date   WBC 12.7 (H) 09/11/2015   HGB 14.3 09/11/2015   HCT 44.4 09/11/2015  MCV 96.7 09/11/2015   PLT 140 (L) 09/11/2015   Lab Results  Component Value Date   NA 139 09/14/2015   K 4.1 09/14/2015   CL 107 09/14/2015   CO2 23 09/14/2015   CREATININE 1.20 09/14/2015   GLUCOSE 104 (H) 09/14/2015    Diagnostic Studies: Ct Angio Chest/abd/pel For Dissection W And/or Wo Contrast  Result Date: 09/10/2015 CLINICAL DATA:  Dissection study - Hypertension, patient complaining of severe anterior chest pain "patient keeps stating something is wrong" He appears grey and diaphoric EXAM: CT ANGIOGRAPHY CHEST, ABDOMEN, PELVIS WITH CONTRAST TECHNIQUE: Multidetector CT  imaging of the chest, abdomen, pelvis was performed using the standard protocol during bolus administration of intravenous contrast. Multiplanar CT image reconstructions and MIPs were obtained to evaluate the vascular anatomy. CONTRAST:  100 cc of isovue 370 mg COMPARISON:  11/11/2010 CT abdomen pelvis FINDINGS: CHEST Vascular: Right arm IV contrast administration. The SVC is patent. Right atrium and right ventricle are nondilated. There is only fair contrast opacification of pulmonary artery branches ; the exam was not optimized for detection of pulmonary emboli. No large central filling defects are identified. Patent bilateral pulmonary veins drain into the left atrium. No left atrial appendage thrombus. Good contrast opacification of the thoracic aorta. Ascending aorta is unremarkable. Bovine variant brachiocephalic arterial origin anatomy without proximal stenosis. Just distal to the origin of left subclavian artery begins an area of asymmetric eccentric wall thickening which extends down through the descending thoracic aorta terminating at approximately the level of the celiac axis origin. This associated with displaced intimal calcifications. The regional wall thickening is not hyperdense on the noncontrast scan, and there is no evidence of active flow within this process consistent with either thrombosed dissection or intramural hematoma. There is no compromise of the true lumen. There is mild dilatation of the distal arch to 3.9 cm, proximal descending thoracic aorta 3.4 cm, mid descending 3.9 cm, distal descending 3.8 cm, returning to normal caliber at the level of the celiac axis origin ; the visualized distal descending thoracic aorta was normal in caliber on prior exam. Mediastinum/Lymph Nodes: No masses or pathologically enlarged lymph nodes identified. No pericardial effusion. Lungs/Pleura: Mild dependent atelectasis posteriorly in both lower lobes. Patient motion degrades evaluation through portions  of the upper lobes. No focal infiltrate or nodule. No pneumothorax. No pleural effusion. Musculoskeletal: Anterior vertebral endplate spurring at multiple levels in the mid and lower thoracic spine. ABDOMEN Arterial Aorta: The type B dissection or intramural hematoma terminates at approximately level of the celiac axis origin without a involvement of this vessel. There is some scattered atheromatous plaque in the infrarenal abdominal aorta. No aneurysm or stenosis. Celiac axis:          Patent Superior mesenteric:  Patent Left renal:           Patent, single, with some motion degradation. Right renal:          Single, patent Inferior mesenteric: Patent, with origin stenosis related to aortic wall plaque. Left iliac: Eccentric plaque in the common iliac without stenosis or aneurysm, otherwise unremarkable. Right iliac: Short-segment origin stenosis of mild severity, of questionable hemodynamic significance. Scattered plaque in the more distal common iliac and external iliac arteries without aneurysm dissection or stenosis. Venous Venous phase imaging not obtained. Review of the MIP images confirms the above findings. Nonvasular Hepatobiliary: No masses or other significant abnormality. Pancreas: No mass, inflammatory changes, or other significant abnormality. Spleen: Within normal limits in size and appearance. Adrenals/Urinary Tract:  No masses identified. No evidence of hydronephrosis. Stomach/Bowel: No evidence of obstruction, inflammatory process, or abnormal fluid collections. Lymphatic: some prominent nodes centrally in the mesenteric with regional wispy changes, similar to what was seen on the prior study. No new adenopathy. Reproductive: Mild prostatic enlargement with central coarse calcifications. Other: No ascites. No free air. Umbilical hernia containing only mesenteric fat. Musculoskeletal: Multilevel lumbar spondylitic changes most marked L4-5 and L5-S1. Negative for fracture. IMPRESSION: 1. Thrombosed  Stanford B/DeBakey III dissection versus intramural hematoma in the thoracic aorta extending from just beyond the subclavian artery origin to the level of the celiac axis, as detailed above. Critical Value/emergent results were called by telephone at the time of interpretation on 09/10/2015 at 8:30 am to Dr. Brantley Stage , who verbally acknowledged these results. 2. Mild aneurysmal dilatation of the descending thoracic aorta to 3.9 cm. 3. Mild origin stenosis of the right common iliac artery, of possible hemodynamic significance. Correlate with clinical symptomatology and ABIs. Electronically Signed   By: Lucrezia Europe M.D.   On: 09/10/2015 08:32     Discharge Medications:   Medication List    TAKE these medications   acetaminophen 325 MG tablet Commonly known as:  TYLENOL Take 2 tablets (650 mg total) by mouth every 6 (six) hours as needed for mild pain or headache (back pain).   aspirin 81 MG tablet Take 81 mg by mouth daily.   cloNIDine 0.2 MG tablet Commonly known as:  CATAPRES Take 1 tablet (0.2 mg total) by mouth 2 (two) times daily.   lisinopril 40 MG tablet Commonly known as:  PRINIVIL,ZESTRIL Take 1 tablet (40 mg total) by mouth daily.   Metoprolol Tartrate 75 MG Tabs Take 75 mg by mouth 2 (two) times daily.       Follow Up Appointments: Follow-up Information    Gaye Pollack, MD Follow up on 10/05/2015.   Specialty:  Cardiothoracic Surgery Why:  Appointment time is at 11:30 am Contact information: [address] [place] [address] [phone]        Peter [place], MD .   Specialty:  Cardiology Why:  Call for a 2 week follow up appointment (Type B aortic dissection, hypertension) Contact information: [place] [address] [phone]           Signed: Lars Pinks MPA-C 09/17/2015, 1:27 PM

## 2015-09-15 NOTE — Progress Notes (Addendum)
      [address]       [place] [address]             [phone]            Subjective: Patient denies chest pain or shortness of breath this am. He would like to go home.  Objective: Vital signs in last 24 hours: Temp:  [97.8 F (36.6 C)-99.3 F (37.4 C)] 98.8 F (37.1 C) (08/31 0648) Pulse Rate:  [68-135] 68 (08/31 0648) Cardiac Rhythm: Normal sinus rhythm (08/30 1950) Resp:  [16-21] 18 (08/31 0648) BP: (112-180)/(56-108) 158/89 (08/31 0648) SpO2:  [97 %-100 %] 98 % (08/31 0648) Weight:  [197 lb 4.8 oz (89.5 kg)] 197 lb 4.8 oz (89.5 kg) (08/31 [id number])   Current Weight  09/15/15 197 lb 4.8 oz (89.5 kg)       Intake/Output from previous day: 08/30 0701 - 08/31 0700 In: 29.3 [I.V.:29.3] Out: -    Physical Exam:  Cardiovascular: RRR Pulmonary: Clear to auscultation bilaterally Abdomen: Soft, non tender, bowel sounds present. Extremities:No lower extremity edema and warm.   Lab Results: CBC:No results for input(s): WBC, HGB, HCT, PLT in the last 72 hours. BMET:  Recent Labs  09/13/15 0200 09/14/15 0228  NA 137 139  K 4.0 4.1  CL 106 107  CO2 25 23  GLUCOSE 112* 104*  BUN 21* 18  CREATININE 1.20 1.20  CALCIUM 9.0 9.0    PT/INR:  Lab Results  Component Value Date   INR 0.98 09/10/2015   INR 0.91 04/04/2010   INR 1.0 04/28/2007   ABG:  INR: Will add last result for INR, ABG once components are confirmed Will add last 4 CBG results once components are confirmed  Assessment/Plan:  1. CV - Type B aortic dissection with hematoma. HR in the 70's. SBP in the 150's this am. On Lopressor 50 mg bid and Lisinopril 40 mg daily.Need to add a third agent. Norvasc is not on $4 [facility] list. Either Hydralazine or daily Clonidine is on $4 list so will discuss with Dr. Cyndia Bent and cardiology. Echo done yesterday showed LVEF [id number], grade 2 diastolic dysfunction, no pericardial effusion, NO AI/AS or MS, trivial MR. 2. Pulmonary-on room air 3.  Once BP better controlled, will discharge. Will ask if should obtain CTA prior to 3 week office visit or will do later.  ZIMMERMAN,DONIELLE MPA-C 09/15/2015,7:44 AM   Chart reviewed, patient examined, agree with above. BP still higher than ideal. Lopressor increased today to 75 bid. May need a third drug but will see how he does overnight. Echo ok. I will plan to do another CT in a month after discharge and will see him back at that time. He will need chest and abdomen/pelvis.

## 2015-09-15 NOTE — Discharge Instructions (Signed)
Aortic Dissection °An aortic dissection is a tear in your aorta. The aorta is the main blood vessel that carries blood out of your heart to supply the rest of your body. It comes out of your heart and curves around, then goes down through your chest (thoracic aorta) and into your belly (abdominal aorta). The wall of the aorta has inner and outer layers. °Aortic dissection occurs most often in the thoracic aorta. This is more likely to happen if the inner layer of the aorta has a weak spot or gets injured. As the dissection widens and blood flows through it, the aorta becomes "double-barreled." This means that one part of the aorta continues to carry blood. However, the inner wall begins to separate from the rest of the aorta as blood flows through the tear. The torn part of the aorta fills with blood. It swells up like a balloon. This can reduce blood flow through the part of the aorta that is still working. Aortic dissection is a medical emergency. °CAUSES °Aortic dissection happens when there is a tear in the inner wall of the aorta. An injury or weakness can cause this tear. Sometimes the exact cause of the tear is not known. °RISK FACTORS °You may be at greater risk for aortic dissection if you: °· Have certain medical conditions, such as uncontrolled high blood pressure or atherosclerosis. °· Have a blunt injury to your chest. °· Have a genetic disorder that affects the connective tissue, such as Marfan syndrome, Turner syndrome, and Ehlers-Danlos syndrome. °· Are born with a problem that affects either your aorta or your heart valve. °· Have a condition that causes inflammation of blood vessels, such as giant cell arteritis. °· Are male. °· Are older than 63 years of age. °· Use cocaine. °· Smoke. °· Lift very heavy weights or do other types of high-intensity resistance training. °SIGNS AND SYMPTOMS  °Signs and symptoms of aortic dissection may start suddenly. Changes in position may make symptoms worse. The  most common symptoms are: °· Severe chest pain that may feel like a tearing, stabbing, or sharp pain. °· Pain that shifts to the shoulder, arm, neck, jaw, abdomen, or hips. °Other symptoms may include: °· Severe abdominal pain. °· Trouble breathing. °· Dizziness or fainting. °· Nausea or vomiting. °· Trouble swallowing. °· Sweating a lot. °· Feeling confused, dazed, anxious, or fearful. °DIAGNOSIS °Your health care provider may suspect aortic dissection based on your signs and symptoms and will perform a physical exam. During the physical exam, your health care provider may listen for abnormal blood flow sounds (murmurs) in your chest or your belly. You may also have your blood pressure checked to see whether it is low or whether there is a difference between the measurements in your arms and your legs. You may also have tests such as: °· Electrocardiogram (ECG). This is a test that measures the electrical activity in your heart. °· Chest X-ray. °· CT scan or MRI. °· Aortic angiogram. This test uses the injection of a dye to make it easier to see your blood vessels clearly. °· Echocardiogram to study your heart using sound waves. °TREATMENT °It is important to treat an aortic dissection as quickly as possible. Your treatment may start as soon as your health care provider suspects aortic dissection. Treatment will depend on how severe your dissection is, where it is located, and your overall health. Treatment options include: °· Medicines to lower your blood pressure. °· Surgery to remove the dissected part of   your aorta and replace it with a graft. °· Medical procedures to thread long, thin tubes (catheters) into the aorta (endovascular procedures). This may be done to place a graft or a balloon in the blood vessel to improve blood flow or prevent further dissection. °HOME CARE INSTRUCTIONS °· Work with your health care provider to keep your blood pressure under control. °· Avoid activities that could cause an  injury to your chest or your abdomen. °· Do not smoke. If you need help quitting, ask your health care provider. °· Do not participate in sports or exercises that involve lifting weights. °· Keep all follow-up visits as directed by your health care provider. This is important. °SEEK MEDICAL CARE IF: °· You develop any new symptoms of aortic dissection after treatment. °SEEK IMMEDIATE MEDICAL CARE IF: °· You have severe pain in your chest or your abdomen. °· You have trouble breathing. °These symptoms may represent a serious problem that is an emergency. Do not wait to see if the symptoms will go away. Get medical help right away. Call your local emergency services (911 in the [place] Do not drive yourself to the hospital. °  °This information is not intended to replace advice given to you by your health care provider. Make sure you discuss any questions you have with your health care provider. °  °Document Released: 04/10/2007 Document Revised: 01/22/2014 Document Reviewed: 08/12/2013 °Elsevier Interactive Patient Education ©2016 [employer] ° °

## 2015-09-15 NOTE — Progress Notes (Signed)
TELEMETRY: Reviewed telemetry pt in NSR: Vitals:   09/14/15 1353 09/14/15 1820 09/14/15 1959 09/15/15 0648  BP: 129/75 (!) 180/108 (!) 162/81 (!) 158/89  Pulse: 68 (!) 135 73 68  Resp: 19  18 18   Temp: 97.8 F (36.6 C) 98.6 F (37 C) 99.3 F (37.4 C) 98.8 F (37.1 C)  TempSrc: Oral Axillary Oral Oral  SpO2: 100% 99% 97% 98%  Weight:    197 lb 4.8 oz (89.5 kg)  Height:        Intake/Output Summary (Last 24 hours) at 09/15/15 0952 Last data filed at 09/15/15 0800  Gross per 24 hour  Intake              240 ml  Output                0 ml  Net              240 ml   Filed Weights   09/10/15 0844 09/10/15 1000 09/15/15 0648  Weight: 230 lb (104.3 kg) 229 lb 15 oz (104.3 kg) 197 lb 4.8 oz (89.5 kg)    Subjective  Denies any pain or SOB. Ambulating in halls.  Marland Kitchen aspirin EC  81 mg Oral Daily  . docusate sodium  100 mg Oral BID  . lisinopril  40 mg Oral Daily  . metoprolol tartrate  50 mg Oral BID  . senna  1 tablet Oral BID  . sodium chloride flush  3 mL Intravenous Q12H      LABS: Basic Metabolic Panel:  Recent Labs  09/13/15 0200 09/14/15 0228  NA 137 139  K 4.0 4.1  CL 106 107  CO2 25 23  GLUCOSE 112* 104*  BUN 21* 18  CREATININE 1.20 1.20  CALCIUM 9.0 9.0   Liver Function Tests: No results for input(s): AST, ALT, ALKPHOS, BILITOT, PROT, ALBUMIN in the last 72 hours. No results for input(s): LIPASE, AMYLASE in the last 72 hours. CBC: No results for input(s): WBC, NEUTROABS, HGB, HCT, MCV, PLT in the last 72 hours. Cardiac Enzymes:  Recent Labs  09/13/15 1312 09/13/15 2029 09/14/15 0228  TROPONINI 0.05* 0.05* 0.05*   BNP: No results for input(s): PROBNP in the last 72 hours. D-Dimer: No results for input(s): DDIMER in the last 72 hours. Hemoglobin A1C: No results for input(s): HGBA1C in the last 72 hours. Fasting Lipid Panel: No results for input(s): CHOL, HDL, LDLCALC, TRIG, CHOLHDL, LDLDIRECT in the last 72 hours. Thyroid Function  Tests: No results for input(s): TSH, T4TOTAL, T3FREE, THYROIDAB in the last 72 hours.  Invalid input(s): FREET3   Radiology/Studies:  No results found.   Echo: Study Conclusions  - Left ventricle: The cavity size was normal. Wall thickness was   increased in a pattern of moderate LVH. Systolic function was   normal. The estimated ejection fraction was in the range of 55%   to 60%. Wall motion was normal; there were no regional wall   motion abnormalities. Features are consistent with a pseudonormal   left ventricular filling pattern, with concomitant abnormal   relaxation and increased filling pressure (grade 2 diastolic   dysfunction).  PHYSICAL EXAM General: Well developed, well nourished, in no acute distress. Head: Normocephalic, atraumatic, sclera non-icteric, oropharynx is clear Neck: Negative for carotid bruits. JVD not elevated. No adenopathy Lungs: Clear bilaterally to auscultation without wheezes, rales, or rhonchi. Breathing is unlabored. Heart: RRR S1 S2 without murmurs, rubs, or gallops.  Abdomen: Soft, non-tender, non-distended with normoactive bowel  sounds. No hepatomegaly. No rebound/guarding. No obvious abdominal masses. Msk:  Strength and tone appears normal for age. Extremities: No clubbing, cyanosis or edema.  Distal pedal pulses are 2+ and equal bilaterally. Neuro: Alert and oriented X 3. Moves all extremities spontaneously. Psych:  Responds to questions appropriately with a normal affect.  ASSESSMENT AND PLAN: 1. Atypical chest pain. Resolved. No active Ecg changes. Troponin minimally elevated with flat curve. This is not consistent with ACS. Echo shows normal LV function. 2. Type B aortic dissection with hematoma.  3. HTN. BP still elevated. On lisinopril 40 mg daily. Will increase metoprolol to 75 mg bid. May need to add hydralazine.  4. Tobacco abuse.   Present on Admission: . Aortic dissection distal to left subclavian ([place])   Signed, Crystalmarie Yasin  [place], [place] 09/15/2015 9:52 AM

## 2015-09-15 NOTE — Progress Notes (Signed)
Physical Therapy Treatment Patient Details Name: Robert Bailey MRN: [id number] DOB: 04-20-1950 Today's Date: 14-Oct-2015    History of Present Illness 65 yo admitted with back and stomach pain with aortic dissection PMHx: HTN, Lt TKA, smoker    PT Comments    Pt mobilizing well. Pt asking about exercise at home. Advised pt to stick to ambulating and active arm and leg ex's. Explained to not perform resistive ex's unless cleared by physician.   Follow Up Recommendations  No PT follow up     Equipment Recommendations  None recommended by PT    Recommendations for Other Services       Precautions / Restrictions Precautions Precautions: Fall Restrictions Weight Bearing Restrictions: No    Mobility  Bed Mobility                  Transfers Overall transfer level: Modified independent Equipment used: None Transfers: Sit to/from Stand Sit to Stand: Modified independent (Device/Increase time)            Ambulation/Gait Ambulation/Gait assistance: Supervision Ambulation Distance (Feet): 500 Feet Assistive device: None Gait Pattern/deviations: Step-through pattern;Decreased stride length Gait velocity: decr Gait velocity interpretation: Below normal speed for age/gender General Gait Details: Pt caught crocs on floor x 2 causing a slight stumble that pt was able to recover from without assist. Believe this was due to crocs which tend to do this. Recommend pt use alternate shoes.   Stairs Stairs: Yes Stairs assistance: Supervision Stair Management: One rail Right;Alternating pattern;Forwards Number of Stairs: 5 General stair comments: No problems with stairs  Wheelchair Mobility    Modified Rankin (Stroke Patients Only)       Balance Overall balance assessment: Needs assistance Sitting-balance support: No upper extremity supported;Feet supported Sitting balance-Leahy Scale: Normal     Standing balance support: No upper extremity supported;During  functional activity Standing balance-Leahy Scale: Good                      Cognition Arousal/Alertness: Awake/alert Behavior During Therapy: WFL for tasks assessed/performed Overall Cognitive Status: Within Functional Limits for tasks assessed                 General Comments: Possibly some mild baseline deficits    Exercises      General Comments        Pertinent Vitals/Pain Pain Assessment: No/denies pain    Home Living                      Prior Function            PT Goals (current goals can now be found in the care plan section) Progress towards PT goals: Progressing toward goals    Frequency  Min 3X/week    PT Plan Current plan remains appropriate    Co-evaluation             End of Session   Activity Tolerance: Patient tolerated treatment well Patient left: in bed;with call bell/phone within reach;with family/visitor present     Time: 1440-1455 PT Time Calculation (min) (ACUTE ONLY): 15 min  Charges:  $Gait Training: 8-22 mins                    G Codes:      Keir Viernes 10/14/2015, 3:19 PM [employer] PT [phone]

## 2015-09-16 ENCOUNTER — Other Ambulatory Visit: Payer: Self-pay | Admitting: Surgery

## 2015-09-16 DIAGNOSIS — I7101 Dissection of thoracic aorta: Secondary | ICD-10-CM

## 2015-09-16 DIAGNOSIS — I71019 Dissection of thoracic aorta, unspecified: Secondary | ICD-10-CM

## 2015-09-16 DIAGNOSIS — I7102 Dissection of abdominal aorta: Secondary | ICD-10-CM | POA: Diagnosis not present

## 2015-09-16 MED ORDER — HYDRALAZINE HCL 10 MG PO TABS: 10.0000 mg | ORAL_TABLET | Freq: Four times a day (QID) | ORAL | Status: DC

## 2015-09-16 MED ORDER — HYDRALAZINE HCL 10 MG PO TABS: 10.0000 mg | ORAL_TABLET | Freq: Three times a day (TID) | ORAL | Status: DC

## 2015-09-16 MED ORDER — ACETAMINOPHEN 325 MG PO TABS
650.0000 mg | ORAL_TABLET | Freq: Four times a day (QID) | ORAL | Status: DC | PRN
  Administered 2015-09-16 – 2015-09-17 (×5): 650 mg via ORAL
  Filled 2015-09-16 (×5): qty 2

## 2015-09-16 MED ORDER — CLONIDINE HCL 0.1 MG PO TABS
0.1000 mg | ORAL_TABLET | Freq: Two times a day (BID) | ORAL | Status: DC
  Administered 2015-09-16 (×2): 0.1 mg via ORAL
  Filled 2015-09-16 (×2): qty 1

## 2015-09-16 NOTE — Progress Notes (Addendum)
      [address]       [place] [address]             [phone]         Subjective: Want to go home. Complaining of back pain per nursing.   Objective: Vital signs in last 24 hours: Temp:  [98.1 F (36.7 C)-98.4 F (36.9 C)] 98.1 F (36.7 C) (09/01 0428) Pulse Rate:  [69-79] 72 (09/01 0428) Cardiac Rhythm: Normal sinus rhythm (08/31 1900) Resp:  [20] 20 (09/01 0428) BP: (135-166)/(80-98) 150/93 (09/01 0428) SpO2:  [96 %-100 %] 96 % (09/01 0428) Weight:  [197 lb 9.6 oz (89.6 kg)] 197 lb 9.6 oz (89.6 kg) (09/01 0428)     Intake/Output from previous day: 08/31 0701 - 09/01 0700 In: 480 [P.O.:480] Out: -  Intake/Output this shift: No intake/output data recorded.  General appearance: alert and cooperative Heart: regular rate and rhythm, S1, S2 normal, no murmur, click, rub or gallop Lungs: clear to auscultation bilaterally Abdomen: soft, non-tender; bowel sounds normal; no masses,  no organomegaly Extremities: extremities normal, atraumatic, no cyanosis or edema Wound: N/A  Lab Results: No results for input(s): WBC, HGB, HCT, PLT in the last 72 hours. BMET:  Recent Labs  09/14/15 0228  NA 139  K 4.1  CL 107  CO2 23  GLUCOSE 104*  BUN 18  CREATININE 1.20  CALCIUM 9.0    PT/INR: No results for input(s): LABPROT, INR in the last 72 hours. ABG    Component Value Date/Time   TCO2 26 09/10/2015 0731   CBG (last 3)  No results for input(s): GLUCAP in the last 72 hours.  Assessment/Plan:   CV- Type B aortic dissection with hematoma. HR in the 70s. SBP in the 150s. He was in the 160s overnight. Cardiology increased Lopressor to 75mg  yesterday and on Lisinopril 40mg  daily. Added Hydralazine PO 10mg  TID and will titrate accordingly. Also has Hydralazine IV PRN.   Pulmonary- on room air  Back pain- will discuss adding Norco PRN with attending  Plan: Continue to work on BP control and plan to f/u with a CT of the chest, abdomen, pelvis in a  month after discharge. Will continue to monitor closely.    LOS: 6 days    Robert Bailey 09/16/2015  His BP is still too high. Will start a third medication. I don't think he is going to take a 3 or 4 times a day medication so will use clonidine bid instead of hydralazine. He can get that at [facility] for $4. He had some back pain this am which he thinks is from the bed. It is different than his dissection pain on presentation. Plan home tomorrow.

## 2015-09-16 NOTE — Progress Notes (Signed)
Hospital Problem List     Principal Problem:   Aortic dissection distal to left subclavian [facility]) Active Problems:   Accelerated hypertension     Patient Profile:   Primary Cardiologist:Previously Dr. Ron Parker in 2009  65 y.o. male w/ PMH of CAD, HTN, tobacco use, and arthritis who presented to [facility] ED on 09/10/2015 for evaluation of sudden onset back and chest pain, found to have Type B Aortic Dissection. Cards consulted for chest pain.   Subjective   Says he "hurts all over" due to how uncomfortable the bed is. Denies any specific chest pain or palpitations. Anxious to go home.   Inpatient Medications    . aspirin EC  81 mg Oral Daily  . docusate sodium  100 mg Oral BID  . hydrALAZINE  10 mg Oral QID  . lisinopril  40 mg Oral Daily  . metoprolol tartrate  75 mg Oral BID  . senna  1 tablet Oral BID  . sodium chloride flush  3 mL Intravenous Q12H    Vital Signs    Vitals:   09/15/15 2017 09/15/15 2107 09/16/15 0428 09/16/15 0744  BP: (!) 162/80 (!) 144/86 (!) 150/93 (!) 165/85  Pulse:  79 72   Resp:   20   Temp:   98.1 F (36.7 C)   TempSrc:   Oral   SpO2:   96%   Weight:   197 lb 9.6 oz (89.6 kg)   Height:        Intake/Output Summary (Last 24 hours) at 09/16/15 0756 Last data filed at 09/16/15 0745  Gross per 24 hour  Intake              483 ml  Output                0 ml  Net              483 ml   Filed Weights   09/10/15 1000 09/15/15 0648 09/16/15 0428  Weight: 229 lb 15 oz (104.3 kg) 197 lb 4.8 oz (89.5 kg) 197 lb 9.6 oz (89.6 kg)    Physical Exam    General: Well developed, well nourished, male appearing in no acute distress. Head: Normocephalic, atraumatic.  Neck: Supple without bruits, JVD not elevated. Lungs:  Resp regular and unlabored, CTA without wheezing or rales. Heart: RRR, S1, S2, no S3, S4, or murmur; no rub. Abdomen: Soft, non-tender, non-distended with normoactive bowel sounds. No hepatomegaly. No rebound/guarding. No obvious  abdominal masses. Extremities: No clubbing, cyanosis, or edema. Distal pedal pulses are 2+ bilaterally. Neuro: Alert and oriented X 3. Moves all extremities spontaneously. Psych: Normal affect.  Labs    CBC No results for input(s): WBC, NEUTROABS, HGB, HCT, MCV, PLT in the last 72 hours. Basic Metabolic Panel  Recent Labs  09/14/15 0228  NA 139  K 4.1  CL 107  CO2 23  GLUCOSE 104*  BUN 18  CREATININE 1.20  CALCIUM 9.0   Liver Function Tests No results for input(s): AST, ALT, ALKPHOS, BILITOT, PROT, ALBUMIN in the last 72 hours. No results for input(s): LIPASE, AMYLASE in the last 72 hours. Cardiac Enzymes  Recent Labs  09/13/15 1312 09/13/15 2029 09/14/15 0228  TROPONINI 0.05* 0.05* 0.05*    Telemetry    NSR, HR in 60's - 70's. 2 pauses noted (1.6 and 2.04 seconds respectively).   ECG    No new tracings.    Cardiac Studies and Radiology    Ct  Angio Chest/abd/pel For Dissection W And/or Wo Contrast  Result Date: 09/10/2015 CLINICAL DATA:  Dissection study - Hypertension, patient complaining of severe anterior chest pain "patient keeps stating something is wrong" He appears grey and diaphoric EXAM: CT ANGIOGRAPHY CHEST, ABDOMEN, PELVIS WITH CONTRAST TECHNIQUE: Multidetector CT imaging of the chest, abdomen, pelvis was performed using the standard protocol during bolus administration of intravenous contrast. Multiplanar CT image reconstructions and MIPs were obtained to evaluate the vascular anatomy. CONTRAST:  100 cc of isovue 370 mg COMPARISON:  11/11/2010 CT abdomen pelvis FINDINGS: CHEST Vascular: Right arm IV contrast administration. The SVC is patent. Right atrium and right ventricle are nondilated. There is only fair contrast opacification of pulmonary artery branches ; the exam was not optimized for detection of pulmonary emboli. No large central filling defects are identified. Patent bilateral pulmonary veins drain into the left atrium. No left atrial appendage  thrombus. Good contrast opacification of the thoracic aorta. Ascending aorta is unremarkable. Bovine variant brachiocephalic arterial origin anatomy without proximal stenosis. Just distal to the origin of left subclavian artery begins an area of asymmetric eccentric wall thickening which extends down through the descending thoracic aorta terminating at approximately the level of the celiac axis origin. This associated with displaced intimal calcifications. The regional wall thickening is not hyperdense on the noncontrast scan, and there is no evidence of active flow within this process consistent with either thrombosed dissection or intramural hematoma. There is no compromise of the true lumen. There is mild dilatation of the distal arch to 3.9 cm, proximal descending thoracic aorta 3.4 cm, mid descending 3.9 cm, distal descending 3.8 cm, returning to normal caliber at the level of the celiac axis origin ; the visualized distal descending thoracic aorta was normal in caliber on prior exam. Mediastinum/Lymph Nodes: No masses or pathologically enlarged lymph nodes identified. No pericardial effusion. Lungs/Pleura: Mild dependent atelectasis posteriorly in both lower lobes. Patient motion degrades evaluation through portions of the upper lobes. No focal infiltrate or nodule. No pneumothorax. No pleural effusion. Musculoskeletal: Anterior vertebral endplate spurring at multiple levels in the mid and lower thoracic spine. ABDOMEN Arterial Aorta: The type B dissection or intramural hematoma terminates at approximately level of the celiac axis origin without a involvement of this vessel. There is some scattered atheromatous plaque in the infrarenal abdominal aorta. No aneurysm or stenosis. Celiac axis:          Patent Superior mesenteric:  Patent Left renal:           Patent, single, with some motion degradation. Right renal:          Single, patent Inferior mesenteric: Patent, with origin stenosis related to aortic wall  plaque. Left iliac: Eccentric plaque in the common iliac without stenosis or aneurysm, otherwise unremarkable. Right iliac: Short-segment origin stenosis of mild severity, of questionable hemodynamic significance. Scattered plaque in the more distal common iliac and external iliac arteries without aneurysm dissection or stenosis. Venous Venous phase imaging not obtained. Review of the MIP images confirms the above findings. Nonvasular Hepatobiliary: No masses or other significant abnormality. Pancreas: No mass, inflammatory changes, or other significant abnormality. Spleen: Within normal limits in size and appearance. Adrenals/Urinary Tract: No masses identified. No evidence of hydronephrosis. Stomach/Bowel: No evidence of obstruction, inflammatory process, or abnormal fluid collections. Lymphatic: some prominent nodes centrally in the mesenteric with regional wispy changes, similar to what was seen on the prior study. No new adenopathy. Reproductive: Mild prostatic enlargement with central coarse calcifications. Other: No ascites. No  free air. Umbilical hernia containing only mesenteric fat. Musculoskeletal: Multilevel lumbar spondylitic changes most marked L4-5 and L5-S1. Negative for fracture. IMPRESSION: 1. Thrombosed Stanford B/DeBakey III dissection versus intramural hematoma in the thoracic aorta extending from just beyond the subclavian artery origin to the level of the celiac axis, as detailed above. Critical Value/emergent results were called by telephone at the time of interpretation on 09/10/2015 at 8:30 am to Dr. Brantley Stage , who verbally acknowledged these results. 2. Mild aneurysmal dilatation of the descending thoracic aorta to 3.9 cm. 3. Mild origin stenosis of the right common iliac artery, of possible hemodynamic significance. Correlate with clinical symptomatology and ABIs. Electronically Signed   By: Lucrezia Europe M.D.   On: 09/10/2015 08:32    Echocardiogram: 09/14/2015 Study Conclusions  -  Left ventricle: The cavity size was normal. Wall thickness was   increased in a pattern of moderate LVH. Systolic function was   normal. The estimated ejection fraction was in the range of 55%   to 60%. Wall motion was normal; there were no regional wall   motion abnormalities. Features are consistent with a pseudonormal   left ventricular filling pattern, with concomitant abnormal   relaxation and increased filling pressure (grade 2 diastolic   dysfunction).  Assessment & Plan    1. Atypical Chest Pain - patient reported having one episode of chest discomfort on 8/29 which lasted for a few seconds then resolved. Denies any exertional chest discomfort or dyspnea prior to admission.  - last ischemic evaluation was a cardiac catheterization in 2009 which showed 25% ostial LCx with distal vessel occluded (confluence of vessels not appearing to empty into any structure), normal LAD, OM1 and OM2, RCA with 25% stenosis and PDA with 30% stenosis.  - EKG without acute ischemic changes. Cyclic troponin values flat at 0.05, likely secondary to demand ischemia in the setting of Type B aortic dissection and accelerated HTN. - echo shows preserved EF of 55-60% with no WMA.  2. Accelerated HTN - SBP initially in the 180's upon arrival to the ED. Currently on Lopressor 75mg  BID and Lisinopril 40mg  daily. Agree with addition of Hydralazine 10mg  Q8H as BP has been 135/80 - 166/98 in the past 24 hours. Would not titrate Lopressor further with pauses up to 2.04 seconds noted on telemetry.  3. Type B Aortic Dissection - CTA on admission showed a thrombosed Stanford B/DeBakey III dissection versus intramural hematoma in the thoracic aorta extending from just beyond the subclavian artery origin to the level of the celiac axis. Mild aneurysmal dilatation of the descending thoracic aorta to 3.9 cm and mild origin stenosis of the right common iliac artery, of possible hemodynamic significance was noted as well.  -  per CT Surgery.   4. AKI - creatinine elevated top 1.58 on admission, improved to 1.20 on 8/30.   5. Tobacco Use - cessation advised.    Arna Medici , PA-C 7:56 AM 09/16/2015 Pager: [phone] Patient seen and examined and history reviewed. Agree with above findings and plan. Feels well. BP still not optimal. Elevated to 165/85 last night. 130/65 this am. Hydralazine added yesterday. On optimal doses of beta blocker and ACEi. Consider increasing hydralazine to 25 mg tid if BP remains elevated.  [place], [place] 09/16/2015 9:31 AM

## 2015-09-17 MED ORDER — ACETAMINOPHEN 325 MG PO TABS: 650.0000 mg | ORAL_TABLET | Freq: Four times a day (QID) | ORAL | Status: AC | PRN

## 2015-09-17 MED ORDER — LISINOPRIL 40 MG PO TABS: 40.0000 mg | ORAL_TABLET | Freq: Every day | ORAL | 1 refills | Status: DC

## 2015-09-17 MED ORDER — CLONIDINE HCL 0.2 MG PO TABS
0.2000 mg | ORAL_TABLET | Freq: Two times a day (BID) | ORAL | 1 refills | Status: DC
Start: 2015-09-17 — End: 2015-10-03

## 2015-09-17 MED ORDER — METOPROLOL TARTRATE 75 MG PO TABS: 75.0000 mg | ORAL_TABLET | Freq: Two times a day (BID) | ORAL | 1 refills | Status: DC

## 2015-09-17 MED ORDER — CLONIDINE HCL 0.2 MG PO TABS
0.2000 mg | ORAL_TABLET | Freq: Two times a day (BID) | ORAL | Status: DC
  Administered 2015-09-17: 0.2 mg via ORAL
  Filled 2015-09-17: qty 1

## 2015-09-17 NOTE — Progress Notes (Addendum)
      [address]       [place] [address]             [phone]            Subjective: Patient states bed very uncomfortable and this is why his back hurts. He does not have chest pain or pain like he did upon admission.  Objective: Vital signs in last 24 hours: Temp:  [97.3 F (36.3 C)-98.2 F (36.8 C)] 97.8 F (36.6 C) (09/02 0424) Pulse Rate:  [62-72] 65 (09/02 0424) Cardiac Rhythm: Normal sinus rhythm (09/02 0700) Resp:  [18-20] 20 (09/02 0424) BP: (126-165)/(56-92) 154/87 (09/02 0558) SpO2:  [100 %] 100 % (09/02 0424)   Current Weight  09/16/15 197 lb 9.6 oz (89.6 kg)       Intake/Output from previous day: 09/01 0701 - 09/02 0700 In: 603 [P.O.:600; I.V.:3] Out: -    Physical Exam:  Cardiovascular: RRR Pulmonary: Clear to auscultation bilaterally Abdomen: Soft, non tender, bowel sounds present. Extremities:No lower extremity edema and warm.   Lab Results: CBC:No results for input(s): WBC, HGB, HCT, PLT in the last 72 hours. BMET: No results for input(s): NA, K, CL, CO2, GLUCOSE, BUN, CREATININE, CALCIUM in the last 72 hours.  PT/INR:  Lab Results  Component Value Date   INR 0.98 09/10/2015   INR 0.91 04/04/2010   INR 1.0 04/28/2007   ABG:  INR: Will add last result for INR, ABG once components are confirmed Will add last 4 CBG results once components are confirmed  Assessment/Plan:  1. CV - Type B aortic dissection with hematoma. HR in the 60's. SBP in the still in the 150's this am. On Lopressor 75 mg bid, Clonidine 0.1 mg bid, and Lisinopril 40 mg daily. Will increase Clonidine. 2. Pulmonary-on room air 3. May discharge late afternoon as long as SBP better controlled.   ZIMMERMAN,DONIELLE MPA-C 09/17/2015,7:43 AM

## 2015-09-30 ENCOUNTER — Ambulatory Visit
Admit: 2015-09-30 | Discharge: 2015-09-30 | Disposition: A | Discharge status: Home / Self Care | Payer: Self-pay | Attending: Surgery | Admitting: Surgery

## 2015-09-30 DIAGNOSIS — I7101 Dissection of thoracic aorta: Secondary | ICD-10-CM

## 2015-09-30 DIAGNOSIS — I71019 Dissection of thoracic aorta, unspecified: Secondary | ICD-10-CM

## 2015-09-30 MED ORDER — IOPAMIDOL (ISOVUE-370) INJECTION 76%
75.0000 mL | Freq: Once | INTRAVENOUS | Status: AC | PRN
  Administered 2015-09-30: 75 mL via INTRAVENOUS

## 2015-10-02 NOTE — Progress Notes (Signed)
Cardiology Office Note    Date:  10/03/2015   ID:  Robert Bailey, DOB [birth year], MRN [id number]  PCP:  No primary care provider on file.  Cardiologist:  Dr. Martinique (previously Dr. Ron Parker in 2009)  Chief Complaint  Patient presents with  . Hospitalization Follow-up    seen for Dr. Martinique  . Follow-up    Chest pain 1-2 occurances, some dizziness, has been walking, Denies SOB, swelling.    History of Present Illness:  Robert Bailey is a 65 y.o. male with PMH of CAD, HTN, tobacco use and arthritis who was recently diagnosed with aortic dissection. Previous cardiac catheterization performed on 04/28/2007 which showed distal left circumflex occlusion, mild plaque elsewhere, however he had unusual confluence of vessels arising from the left circumflex to the atria. He has not followed up since 2009. He was recently seen in the emergency room on 09/10/2015 after presented with chest pain and back pain that woke him up around 5:30 AM. It was associated with nausea and diaphoresis. Blood pressure on arrival was over [id number] systolic. CT angiogram of the chest abdomen and pelvis obtained which was concerning for type B aortic dissection beginning just distal to the left subclavian artery and extending to the level celiac axis with complete thrombosis of the false lumen. Mild aneurysmal dilatation of the descending thoracic aorta to 3.9 cm with mild origin stenosis of the right common iliac artery. CT surgery was consulted, patient was seen by Dr. Cyndia Bent. According to a marginal H&P, patient stopped his blood pressure medication because there were no refills and he could not afford to continue on them after he lost his job several years ago. EKG shows sinus rhythm with LVH. He was started on Nipride infusion along with esmolol drip. His pain resolved after started on IV antihypertensive medications. His medication was eventually switched to labetalol drip followed by Lopressor 50 mg twice a day.  Cardiology  service was consulted on the/29/2017 for chest pain after patient became agitated and frustrated with the staff, removing leads and refused to use call bell. His blood pressure did elevate to 160s, his labetalol drip was briefly restarted. After his renal function improved, lisinopril was added. EKG obtained showed normal sinus rhythm, LVH with repolarization abnormality in the anterior leads, T wave inversion in V5 and V6. Serial troponin was minimally elevated to 0.05 which was likely related to demand ischemia in the setting of type B aortic dissection and accelerated hypertension. Echocardiogram obtained on 09/14/2015 showed EF 55-60%, no regional wall motion abnormality, grade 2 diastolic dysfunction, moderate LVH. Prior to discharge, his Lopressor was increased to 75 mg twice a day, lisinopril was increased to 40 mg daily. We were unable to up titrate Lopressor due to pauses up to 2.04 seconds on telemetry. Clonidine 0.2 mg twice a day was added prior to discharge as well.  He was eventually discharged on 09/16/2015. Since discharge, he did have a repeat CT angiogram of the chest on 09/30/2015 which showed illusion interval improvement of the type B aortic dissection when compared to the previous image. Maximal diameter of the proximal descending aorta is 3.6 compared to the previous 3.9 cm. The crescentic intramural hematoma is significantly less conspicuous. He did have a small aortic branch artery pseudoaneurysm arising from the intercostal artery at the 8 o'clock position of descending thoracic aorta.  Since discharge, patient did initially have some mild dizziness with ambulation and also intermittent chest discomfort lasting only seconds at a time at rest.  He otherwise has no exertional symptom. He says the chest discomfort resolved as quickly as it came on and that they were not associated with any exertional type activity. His EKG is unchanged today. As far as his demented chest discomfort, I'm more  inclined to observe it for now. He has been monitoring his blood pressure twice a day. Based on his blood pressure diary, his systolic blood pressure has been ranging 120 to 130s since discharge. We discussed the need to be compliant on blood pressure medication and ideally control his systolic blood pressure in the range of 110 to 120s. I will increase his clonidine to 0.3 mg twice a day. I did not increase his metoprolol as he had 2 second pause in the hospital, and his current blood heart rate is 56. He is on the maximum dose of lisinopril, I will obtain a basic metabolic panel today. He has not had a lipid panel in since 2009, unfortunately he had a cup of coffee this morning, I will obtain outpatient lipid panel which he can obtain at a later time. I will also refill his metoprolol and lisinopril. He will have 2 month follow-up with Dr. Martinique.   Past Medical History:  Diagnosis Date  . Arthritis   . Hypertension     Past Surgical History:  Procedure Laterality Date  . REPLACEMENT TOTAL KNEE Left 2013    Current Medications: Outpatient Medications Prior to Visit  Medication Sig Dispense Refill  . acetaminophen (TYLENOL) 325 MG tablet Take 2 tablets (650 mg total) by mouth every 6 (six) hours as needed for mild pain or headache (back pain).    Marland Kitchen aspirin 81 MG tablet Take 81 mg by mouth daily.    . cloNIDine (CATAPRES) 0.2 MG tablet Take 1 tablet (0.2 mg total) by mouth 2 (two) times daily. 60 tablet 1  . lisinopril (PRINIVIL,ZESTRIL) 40 MG tablet Take 1 tablet (40 mg total) by mouth daily. 30 tablet 1  . Metoprolol Tartrate 75 MG TABS Take 75 mg by mouth 2 (two) times daily. 60 tablet 1   No facility-administered medications prior to visit.      Allergies:   Penicillins   Social History   Social History  . Marital status: Married    Spouse name: Nazier Gebo  . Number of children: 2  . Years of education: N/A   Occupational History  . retired    Social History Main Topics   . Smoking status: Current Every Day Smoker    Packs/day: 1.00    Types: Cigarettes, E-cigarettes  . Smokeless tobacco: Never Used  . Alcohol use No  . Drug use: No  . Sexual activity: Not Asked   Other Topics Concern  . None   Social History Narrative  . None     Family History:  The patient's family history includes Heart attack (age of onset: 82) in his brother; Hypertension in his mother.   ROS:   Please see the history of present illness.    ROS All other systems reviewed and are negative.   PHYSICAL EXAM:   VS:  BP (!) 142/98   Pulse (!) 56   Ht 5\' 11"  (1.803 m)   Wt 200 lb 9.6 oz (91 kg)   BMI 27.98 kg/m    GEN: Well nourished, well developed, in no acute distress  HEENT: normal  Neck: no JVD, carotid bruits, or masses Cardiac: RRR; no murmurs, rubs, or gallops,no edema  Respiratory:  clear to auscultation bilaterally, normal  work of breathing GI: soft, nontender, nondistended, + BS MS: no deformity or atrophy  Skin: warm and dry, no rash Neuro:  Alert and Oriented x 3, Strength and sensation are intact Psych: euthymic mood, full affect  Wt Readings from Last 3 Encounters:  10/03/15 200 lb 9.6 oz (91 kg)  09/16/15 197 lb 9.6 oz (89.6 kg)      Studies/Labs Reviewed:   EKG:  EKG is ordered today.  The ekg ordered today demonstrates Normal sinus rhythm, T wave flattening in the lateral leads unchanged compared to the previous EKG.  Recent Labs: 09/11/2015: ALT 22; Hemoglobin 14.3; Platelets 140 09/14/2015: BUN 18; Creatinine, Ser 1.20; Potassium 4.1; Sodium 139   Lipid Panel    Component Value Date/Time   CHOL  04/28/2007 0330    159        ATP III CLASSIFICATION:  <200     mg/dL   Desirable  200-239  mg/dL   Borderline High  >=240    mg/dL   High   TRIG 138 04/28/2007 0330   HDL 24 (L) 04/28/2007 0330   CHOLHDL 6.6 04/28/2007 0330   VLDL 28 04/28/2007 0330   LDLCALC (H) 04/28/2007 0330    107        Total Cholesterol/HDL:CHD Risk Coronary  Heart Disease Risk Table                     Men   Women  1/2 Average Risk   3.4   3.3    Additional studies/ records that were reviewed today include:   Cath 04/28/2007   CONCLUSION:  Distal circumflex occlusion.  Mild plaque elsewhere.  Unusual confluence of vessels arising from the circumflex toward the  atria.   PLAN:  We will get an echocardiogram to evaluate left atrial coronary  and sinus size.  The patient will need aggressive primary risk  reduction.     CTA of chest 09/10/2015 IMPRESSION: 1. Thrombosed Stanford B/DeBakey III dissection versus intramural hematoma in the thoracic aorta extending from just beyond the subclavian artery origin to the level of the celiac axis, as detailed above. Critical Value/emergent results were called by telephone at the time of interpretation on 09/10/2015 at 8:30 am to Dr. Brantley Stage , who verbally acknowledged these results. 2. Mild aneurysmal dilatation of the descending thoracic aorta to 3.9 cm. 3. Mild origin stenosis of the right common iliac artery, of possible hemodynamic significance. Correlate with clinical symptomatology and ABIs.     Echo 09/14/2015 LV EF: 55% -   60%  ------------------------------------------------------------------- Indications:      441.00 Dissection of Aorta Aneurysm, any site. Chest pain 786.51.  ------------------------------------------------------------------- History:   Risk factors:  Former tobacco use. Hypertension.  ------------------------------------------------------------------- Study Conclusions  - Left ventricle: The cavity size was normal. Wall thickness was   increased in a pattern of moderate LVH. Systolic function was   normal. The estimated ejection fraction was in the range of 55%   to 60%. Wall motion was normal; there were no regional wall   motion abnormalities. Features are consistent with a pseudonormal   left ventricular filling pattern, with concomitant  abnormal   relaxation and increased filling pressure (grade 2 diastolic   dysfunction).   CTA of chest 09/30/2015 1. Evolution and interval improvement of the Stanford type B aortic dissection/ intramural hematoma compared 09/10/2015. Both the maximal diameter of the proximal descending thoracic aorta and the more distal descending thoracic aorta measure 3.6 cm today compared  to 3.9 cm previously. The crescentic intramural hematoma is significantly less conspicuous. 2. Incidental note is made of a small aortic branch artery pseudoaneurysm arising from an intercostal artery at the 8 o'clock position of the descending thoracic aorta. This is a finding of no clinical significance.  ASSESSMENT:    1. Aortic dissection, thoracoabdominal ([place])   2. Essential hypertension   3. Coronary artery disease involving native coronary artery of native heart without angina pectoris   4. Tobacco abuse   5. Medication management   6. Screening for hyperlipidemia      PLAN:  In order of problems listed above:  1. Type B aortic dissection  - Reemphasized the need to have strict blood pressure control, ideally given his type B aortic dissection, we'll try to keep his systolic blood pressure in the 110s to 120s range. On the medication around 7 AM and the afternoon medication at 5 PM. His blood pressure has been obtained as the exact time of taking blood pressure medication. I will increase his clonidine to 0.3 mg twice a day. He has occasional dizziness, however this has not been bad. Furthermore, I have recommended for him to check blood pressure roughly 2 hours after taking morning medication and also 2 hours after taking p.m. medication. He understands very well and that he needs strict blood pressure control, and we'll continue to keep a blood pressure diary with twice a day measurement.  2. CAD: Note previous cardiac cath resulting 2009. Recent echocardiogram shows normal EF. Continue to have  intermittent chest discomfort, however only lasting a few seconds at a time, is not associated with exertion. Symptoms very atypical for angina, will recommend continuous observation for now. Will also obtain fasting lipid panel, unfortunately he did drink coffee today  3. HTN: According to the patient, due to cost issue, he did not take blood pressure medication for up to 4 years prior to recent admission for type B aortic dissection, however he has since then been very compliant with his medication. He also keep a blood pressure diary with 2 readings per day. Given the start of lisinopril, will obtain basic metabolic panel.  4. Tobacco abuse: He on forcefully continued to smoke, however he says he is cutting back. I have further discussed the relationship between tobacco abuse and CAD, and encouraged the patient to focus on tobacco cessation. He has been transitioning to E cigarette.    Medication Adjustments/Labs and Tests Ordered: Current medicines are reviewed at length with the patient today.  Concerns regarding medicines are outlined above.  Medication changes, Labs and Tests ordered today are listed in the Patient Instructions below. Patient Instructions  Medication Instructions:  Your physician has recommended you make the following change in your medication:  1- INCREASE Clonidine to 0.3 mg (1 tablet) by mouth twice a day.   Labwork: 1-  Your physician recommends that you return for lab work TODAY.  2-  Your physician recommends that you return for lab work AT [place]. DO NOT EAT OR DRINK PAST MIDNIGHT THE MORNING OF THE LAB WORK. You may have sips of water with your medication.   Follow-Up: Your physician recommends that you schedule a follow-up appointment in: 2 MONTHS WITH DR Martinique.   Any Other Special Instructions Will Be Listed Below (If Applicable). Your physician has requested that you regularly monitor and record your blood pressure  readings at home [place]  BLOOD PRESSURE MEDICATION. Please use the same machine at the same time of day to check your readings and record them to bring to your follow-up visit.      If you need a refill on your cardiac medications before your next appointment, please call your pharmacy.      Hilbert Corrigan, [place]  10/03/2015 12:06 PM    [address] [place], [place], [place]  [address] Phone: [phone]; Fax: [phone]

## 2015-10-03 ENCOUNTER — Encounter: Payer: Self-pay | Admitting: Physician Assistant

## 2015-10-03 ENCOUNTER — Ambulatory Visit (INDEPENDENT_AMBULATORY_CARE_PROVIDER_SITE_OTHER): Payer: Self-pay | Admitting: Physician Assistant

## 2015-10-03 VITALS — BP 142/98 | HR 56 | Ht 71.0 in | Wt 200.6 lb

## 2015-10-03 DIAGNOSIS — I2510 Atherosclerotic heart disease of native coronary artery without angina pectoris: Secondary | ICD-10-CM

## 2015-10-03 DIAGNOSIS — Z79899 Other long term (current) drug therapy: Secondary | ICD-10-CM

## 2015-10-03 DIAGNOSIS — Z720 Tobacco use: Secondary | ICD-10-CM

## 2015-10-03 DIAGNOSIS — Z13220 Encounter for screening for lipoid disorders: Secondary | ICD-10-CM

## 2015-10-03 DIAGNOSIS — I7103 Dissection of thoracoabdominal aorta: Secondary | ICD-10-CM

## 2015-10-03 DIAGNOSIS — I10 Essential (primary) hypertension: Secondary | ICD-10-CM

## 2015-10-03 LAB — BASIC METABOLIC PANEL
BUN: 16 mg/dL (ref 7–25)
CALCIUM: 9.3 mg/dL (ref 8.6–10.3)
CHLORIDE: 102 mmol/L (ref 98–110)
CO2: 28 mmol/L (ref 20–31)
CREATININE: 1.23 mg/dL (ref 0.70–1.25)
Glucose, Bld: 89 mg/dL (ref 65–99)
Potassium: 5.1 mmol/L (ref 3.5–5.3)
Sodium: 139 mmol/L (ref 135–146)

## 2015-10-03 MED ORDER — METOPROLOL TARTRATE 75 MG PO TABS: 75.0000 mg | ORAL_TABLET | Freq: Two times a day (BID) | ORAL | 3 refills | Status: DC

## 2015-10-03 MED ORDER — CLONIDINE HCL 0.3 MG PO TABS: 0.3000 mg | ORAL_TABLET | Freq: Two times a day (BID) | ORAL | 3 refills | Status: DC

## 2015-10-03 MED ORDER — LISINOPRIL 40 MG PO TABS: 40.0000 mg | ORAL_TABLET | Freq: Every day | ORAL | 3 refills | Status: DC

## 2015-10-03 MED ORDER — LISINOPRIL 40 MG PO TABS
40.0000 mg | ORAL_TABLET | Freq: Every day | ORAL | 3 refills | Status: DC
Start: 2015-10-03 — End: 2015-10-03

## 2015-10-03 NOTE — Patient Instructions (Addendum)
Medication Instructions:  Your physician has recommended you make the following change in your medication:  1- INCREASE Clonidine to 0.3 mg (1 tablet) by mouth twice a day.   Labwork: 1-  Your physician recommends that you return for lab work TODAY.  2-  Your physician recommends that you return for lab work AT [place]. DO NOT EAT OR DRINK PAST MIDNIGHT THE MORNING OF THE LAB WORK. You may have sips of water with your medication.   Follow-Up: Your physician recommends that you schedule a follow-up appointment in: 2 MONTHS WITH DR Martinique.   Any Other Special Instructions Will Be Listed Below (If Applicable). Your physician has requested that you regularly monitor and record your blood pressure readings at home [facility]. Please use the same machine at the same time of day to check your readings and record them to bring to your follow-up visit.      If you need a refill on your cardiac medications before your next appointment, please call your pharmacy.

## 2015-10-05 ENCOUNTER — Encounter: Payer: Self-pay | Admitting: Surgery

## 2015-10-05 ENCOUNTER — Ambulatory Visit (INDEPENDENT_AMBULATORY_CARE_PROVIDER_SITE_OTHER): Payer: Self-pay | Admitting: Surgery

## 2015-10-05 VITALS — BP 135/88 | HR 56 | Resp 20 | Ht 71.0 in | Wt 200.0 lb

## 2015-10-05 DIAGNOSIS — I71019 Dissection of thoracic aorta, unspecified: Secondary | ICD-10-CM

## 2015-10-05 DIAGNOSIS — I7101 Dissection of thoracic aorta: Secondary | ICD-10-CM

## 2015-10-06 ENCOUNTER — Telehealth: Payer: Self-pay | Admitting: *Deleted

## 2015-10-06 DIAGNOSIS — I10 Essential (primary) hypertension: Secondary | ICD-10-CM

## 2015-10-06 NOTE — Telephone Encounter (Signed)
Pt aware of his lab results and will go into [facility] and have lab work rechecked. Order in [place]. Pt agreeable with this plan and verbalized understanding.

## 2015-10-06 NOTE — Telephone Encounter (Signed)
-----   Message from [place], [place] sent at 10/05/2015  4:48 PM EDT ----- Continue lisinopril, repeat BMET in 1 week to check K, his potassium has been going up, need to followup on this

## 2015-10-11 ENCOUNTER — Encounter: Payer: Self-pay | Admitting: Surgery

## 2015-10-11 NOTE — Progress Notes (Signed)
HPI:  The patient returns today for follow up of an acute type B dissection occurring on 09/10/2015. He had an uncomplicated hospital course with rapid resolution of his pain and control of his BP on IV meds transitioned to po meds. Since discharge he has felt much better without chest or back pain. He has been taking his meds as directed. He has not smoked since discharge.   Current Outpatient Prescriptions  Medication Sig Dispense Refill  . acetaminophen (TYLENOL) 325 MG tablet Take 2 tablets (650 mg total) by mouth every 6 (six) hours as needed for mild pain or headache (back pain).    Marland Kitchen aspirin 81 MG tablet Take 81 mg by mouth daily.    . cloNIDine (CATAPRES) 0.3 MG tablet Take 1 tablet (0.3 mg total) by mouth 2 (two) times daily. 180 tablet 3  . docusate sodium (COLACE) 100 MG capsule Take 100 mg by mouth 2 (two) times daily.    Marland Kitchen lisinopril (PRINIVIL,ZESTRIL) 40 MG tablet Take 1 tablet (40 mg total) by mouth daily. 90 tablet 3  . Metoprolol Tartrate 75 MG TABS Take 75 mg by mouth 2 (two) times daily. 180 tablet 3   No current facility-administered medications for this visit.      Physical Exam: BP 135/88 (BP Location: Right Arm, Patient Position: Sitting, Cuff Size: Normal)   Pulse (!) 56   Resp 20   Ht 5\' 11"  (1.803 m)   Wt 200 lb (90.7 kg)   SpO2 97% Comment: RA  BMI 27.89 kg/m  He looks well Cardiac exam shows a regular rate and rhythm with normal heart sounds Lung exam is clear There is no peripheral edema  Diagnostic Tests:  CLINICAL DATA:  65 year old male with a history of aortic dissection  EXAM: CT ANGIOGRAPHY CHEST WITH CONTRAST  TECHNIQUE: Multidetector CT imaging of the chest was performed using the standard protocol during bolus administration of intravenous contrast. Multiplanar CT image reconstructions and MIPs were obtained to evaluate the vascular anatomy.  CONTRAST:  75 mL Isovue 370  COMPARISON:  Prior CT scan of the chest  09/10/2015  FINDINGS: Cardiovascular: 2 vessel arch anatomy. The right brachiocephalic and left common carotid arteries share a common origin. Interval evolution of the previously noted thrombosed aortic dissection versus acute intramural hematoma. The crescentic thickening along the aortic wall is significantly less conspicuous than previously seen. No evidence of retrograde propagation of the dissection flap into the arch or ascending aorta. The maximal diameter of the most proximal descending thoracic aorta is 3.6 cm slightly improved compared to 3.9 cm. This is largely due to remodeling of the intramural hematoma. There is a solitary small branch artery pseudoaneurysm arising from the 8 o'clock position on image 244 of series 10. This is a finding of new no clinical significance. The more descending thoracic aorta measures up to 3.6 cm compared to 3.9 cm previously. The heart is normal in size. No pericardial effusion.  Mediastinum/Nodes: No evidence of a mediastinal hematoma or hemorrhage. Unremarkable CT appearance of the thyroid gland. No suspicious mediastinal or hilar adenopathy. No soft tissue mediastinal mass. The thoracic esophagus is unremarkable.  Lungs/Pleura: Mild centrilobular pulmonary emphysema. No consolidation, pleural effusion or pneumothorax. No suspicious nodule or mass.  Upper Abdomen: Visualized upper abdominal organs are unremarkable.  Musculoskeletal: No acute fracture or aggressive appearing lytic or blastic osseous lesion.  Review of the MIP images confirms the above findings.  IMPRESSION: VASCULAR  1. Evolution and interval improvement of the Stanford  type B aortic dissection/ intramural hematoma compared 09/10/2015. Both the maximal diameter of the proximal descending thoracic aorta and the more distal descending thoracic aorta measure 3.6 cm today compared to 3.9 cm previously. The crescentic intramural hematoma is significantly less  conspicuous. 2. Incidental note is made of a small aortic branch artery pseudoaneurysm arising from an intercostal artery at the 8 o'clock position of the descending thoracic aorta. This is a finding of no clinical significance. NON VASCULAR  1. No acute abnormality.  Signed,  Criselda Peaches, MD  [facility]  [facility]   Electronically Signed   By: Jacqulynn Cadet M.D.   On: 09/30/2015 11:58   Impression:  There has been interval improvement in the now chronic type B dissection with the diameter of the descending aorta decreasing from 3.9 cm to 3.6 cm with the thrombosed false lumen less conspicuous. I discussed the importance of good blood pressure control long term and smoking cessation.   Plan:  I will see him back in one year with a CTA of the chest.   Gaye Pollack, MD [facility] [phone]

## 2015-10-12 ENCOUNTER — Other Ambulatory Visit: Payer: Self-pay | Admitting: *Deleted

## 2015-10-12 DIAGNOSIS — I10 Essential (primary) hypertension: Secondary | ICD-10-CM

## 2015-10-12 LAB — BASIC METABOLIC PANEL
BUN: 17 mg/dL (ref 7–25)
CALCIUM: 9 mg/dL (ref 8.6–10.3)
CO2: 29 mmol/L (ref 20–31)
CREATININE: 1.43 mg/dL — AB (ref 0.70–1.25)
Chloride: 102 mmol/L (ref 98–110)
GLUCOSE: 95 mg/dL (ref 65–99)
Potassium: 5 mmol/L (ref 3.5–5.3)
Sodium: 139 mmol/L (ref 135–146)

## 2015-10-13 LAB — LIPID PANEL
CHOL/HDL RATIO: 6.2 ratio — AB (ref <=5.0)
CHOLESTEROL: 161 mg/dL (ref 125–200)
HDL: 26 mg/dL — ABNORMAL LOW (ref >=40)
LDL Cholesterol: 101 mg/dL (ref <130)
Triglycerides: 172 mg/dL — ABNORMAL HIGH (ref <150)
VLDL: 34 mg/dL — ABNORMAL HIGH (ref <30)

## 2015-10-16 ENCOUNTER — Other Ambulatory Visit: Payer: Self-pay | Admitting: Physician Assistant

## 2015-10-17 ENCOUNTER — Other Ambulatory Visit: Payer: Self-pay | Admitting: *Deleted

## 2015-10-17 DIAGNOSIS — I25810 Atherosclerosis of coronary artery bypass graft(s) without angina pectoris: Secondary | ICD-10-CM

## 2015-10-17 DIAGNOSIS — R748 Abnormal levels of other serum enzymes: Secondary | ICD-10-CM

## 2015-10-17 MED ORDER — ATORVASTATIN CALCIUM 20 MG PO TABS: 20.0000 mg | ORAL_TABLET | Freq: Every day | ORAL | 6 refills | Status: DC

## 2015-10-28 ENCOUNTER — Telehealth: Payer: Self-pay | Admitting: Cardiology

## 2015-10-28 NOTE — Telephone Encounter (Signed)
Patient saw Robert Bailey at last appt, was prescribed lipitor due to abnormal labs. Pt states he is scared of starting lipitor, and has not picked up prescription yet. He voices concern that his 2 brothers had both taken lipitor and had bad responses to it. Notes that at least one of these brothers is on crestor, as is his daughter, and they note no SEs w crestor. Pt would like to see if Dr. Martinique amenable to prescribing this instead.  Aware I will route to provider to advise, and follow up with him. Pt appreciative of assistance.

## 2015-10-28 NOTE — Telephone Encounter (Signed)
Pt says he does not want to take Lipitor,he wants Dr Martinique to prescribe something else.

## 2015-10-28 NOTE — Telephone Encounter (Signed)
It is OK for him to take crestor 10 mg daily instead.  Shamone Winzer Martinique MD, [facility]

## 2015-10-31 MED ORDER — ROSUVASTATIN CALCIUM 10 MG PO TABS: 10.0000 mg | ORAL_TABLET | Freq: Every day | ORAL | 6 refills | Status: DC

## 2015-10-31 NOTE — Telephone Encounter (Signed)
Pt is returning phone call ° °

## 2015-10-31 NOTE — Telephone Encounter (Signed)
Returned call to patient.Dr.Jordan advised ok to take Crestor 10 mg daily.Prescription sent to pharmacy.

## 2015-10-31 NOTE — Telephone Encounter (Signed)
Called patient no answer.LMTC. 

## 2015-11-02 ENCOUNTER — Other Ambulatory Visit: Payer: PRIVATE HEALTH INSURANCE | Admitting: *Deleted

## 2015-11-02 DIAGNOSIS — R748 Abnormal levels of other serum enzymes: Secondary | ICD-10-CM

## 2015-11-02 LAB — BASIC METABOLIC PANEL
BUN: 14 mg/dL (ref 7–25)
CALCIUM: 9.2 mg/dL (ref 8.6–10.3)
CO2: 25 mmol/L (ref 20–31)
Chloride: 105 mmol/L (ref 98–110)
Creat: 1.35 mg/dL — ABNORMAL HIGH (ref 0.70–1.25)
Glucose, Bld: 96 mg/dL (ref 65–99)
POTASSIUM: 5 mmol/L (ref 3.5–5.3)
SODIUM: 138 mmol/L (ref 135–146)

## 2015-11-03 ENCOUNTER — Telehealth: Payer: Self-pay | Admitting: Physician Assistant

## 2015-11-03 NOTE — Telephone Encounter (Signed)
Results given to pt. Pt verbalized understanding.  

## 2015-11-03 NOTE — Telephone Encounter (Signed)
-----   Message from [place], [place] sent at 11/02/2015  9:36 PM EDT ----- Kidney function and potassium level stable, no change to current plan

## 2015-12-21 NOTE — Progress Notes (Signed)
Cardiology Office Note    Date:  12/23/2015   ID:  Robert Bailey, DOB [birth year], MRN [id number]  PCP:  Neale Burly, MD  Cardiologist:  Dr. Martinique (previously Dr. Ron Parker in 2009)  Chief Complaint  Patient presents with  . Follow-up  . Dizziness    occassionally.    History of Present Illness:  Robert Bailey is a 65 y.o. male with PMH of CAD, HTN, tobacco use and arthritis who is seen for follow up of type B aortic dissection.  Previous cardiac catheterization performed on 04/28/2007 which showed distal left circumflex occlusion, mild plaque elsewhere, however he had unusual confluence of vessels arising from the left circumflex to the atria.    He was admitted on 09/10/2015 after presented with chest pain and back pain. Blood pressure on arrival was over [id number] systolic. CT angiogram of the chest abdomen and pelvis obtained which was concerning for type B aortic dissection beginning just distal to the left subclavian artery and extending to the level celiac axis with complete thrombosis of the false lumen. Mild aneurysmal dilatation of the descending thoracic aorta to 3.9 cm with mild origin stenosis of the right common iliac artery. CT surgery was consulted, patient was seen by Dr. Cyndia Bent. Patient had stopped his blood pressure medication because there were no refills and he could not afford to continue on them after he lost his job several years ago.   Echocardiogram obtained on 09/14/2015 showed EF 55-60%, no regional wall motion abnormality, grade 2 diastolic dysfunction, moderate LVH. Prior to discharge, his Lopressor was increased to 75 mg twice a day, lisinopril was increased to 40 mg daily. We were unable to up titrate Lopressor due to pauses up to 2.04 seconds on telemetry. Clonidine 0.2 mg twice a day was added prior to discharge as well.  He was  discharged on 09/16/2015. Since discharge, he did have a repeat CT angiogram of the chest on 09/30/2015 which showed illusion interval  improvement of the type B aortic dissection when compared to the previous image. Maximal diameter of the proximal descending aorta is 3.6 compared to the previous 3.9 cm. The crescentic intramural hematoma is significantly less conspicuous. He did have a small aortic branch artery pseudoaneurysm arising from the intercostal artery at the 8 o'clock position of descending thoracic aorta.  On follow up today he is feeling very well. No chest, back, or leg pain. Walking every day and doing some light weight lifting. BP has been persistently high. He was started on isosorbide but could not tolerate due to severe HA. Placed on chlorthalidone earlier this week and BP has responded nicely. No dizziness. Sleeping well.  Hasn't been taking crestor due to cost.   Past Medical History:  Diagnosis Date  . Arthritis   . Hypertension     Past Surgical History:  Procedure Laterality Date  . REPLACEMENT TOTAL KNEE Left 2013    Current Medications: Outpatient Medications Prior to Visit  Medication Sig Dispense Refill  . acetaminophen (TYLENOL) 325 MG tablet Take 2 tablets (650 mg total) by mouth every 6 (six) hours as needed for mild pain or headache (back pain).    Marland Kitchen aspirin 81 MG tablet Take 81 mg by mouth daily.    . cloNIDine (CATAPRES) 0.3 MG tablet Take 1 tablet (0.3 mg total) by mouth 2 (two) times daily. 180 tablet 3  . docusate sodium (COLACE) 100 MG capsule Take 100 mg by mouth 2 (two) times daily.    Marland Kitchen  lisinopril (PRINIVIL,ZESTRIL) 40 MG tablet Take 1 tablet (40 mg total) by mouth daily. 90 tablet 3  . Metoprolol Tartrate 75 MG TABS Take 75 mg by mouth 2 (two) times daily. 180 tablet 3  . rosuvastatin (CRESTOR) 10 MG tablet Take 1 tablet (10 mg total) by mouth daily. 30 tablet 6   No facility-administered medications prior to visit.      Allergies:   Penicillins   Social History   Social History  . Marital status: Married    Spouse name: Mervin Nitzsche  . Number of children: 2  . Years  of education: N/A   Occupational History  . retired    Social History Main Topics  . Smoking status: Current Every Day Smoker    Packs/day: 1.00    Types: Cigarettes, E-cigarettes  . Smokeless tobacco: Never Used  . Alcohol use No  . Drug use: No  . Sexual activity: Not Asked   Other Topics Concern  . None   Social History Narrative  . None     Family History:  The patient's family history includes Heart attack (age of onset: 28) in his brother; Hypertension in his mother.   ROS:   Please see the history of present illness.    ROS All other systems reviewed and are negative.   PHYSICAL EXAM:   VS:  BP 92/64   Pulse (!) 59   Ht 5\' 11"  (1.803 m)   Wt 201 lb 9.6 oz (91.4 kg)   BMI 28.12 kg/m    GEN: Well nourished, well developed, in no acute distress  HEENT: normal  Neck: no JVD, carotid bruits, or masses Cardiac: RRR; no murmurs, rubs, or gallops,no edema  Respiratory:  clear to auscultation bilaterally, normal work of breathing GI: soft, nontender, nondistended, + BS MS: no deformity or atrophy  Skin: warm and dry, no rash Neuro:  Alert and Oriented x 3, Strength and sensation are intact Psych: euthymic mood, full affect  Wt Readings from Last 3 Encounters:  12/23/15 201 lb 9.6 oz (91.4 kg)  10/05/15 200 lb (90.7 kg)  10/03/15 200 lb 9.6 oz (91 kg)      Studies/Labs Reviewed:   EKG:  EKG is not ordered today.   Recent Labs: 09/11/2015: ALT 22; Hemoglobin 14.3; Platelets 140 11/02/2015: BUN 14; Creat 1.35; Potassium 5.0; Sodium 138   Lipid Panel    Component Value Date/Time   CHOL 161 10/12/2015 0922   TRIG 172 (H) 10/12/2015 0922   HDL 26 (L) 10/12/2015 0922   CHOLHDL 6.2 (H) 10/12/2015 0922   VLDL 34 (H) 10/12/2015 0922   LDLCALC 101 10/12/2015 0922    Additional studies/ records that were reviewed today include:   Cath 04/28/2007   CONCLUSION:  Distal circumflex occlusion.  Mild plaque elsewhere.  Unusual confluence of vessels arising from  the circumflex toward the  atria.   PLAN:  We will get an echocardiogram to evaluate left atrial coronary  and sinus size.  The patient will need aggressive primary risk  reduction.     CTA of chest 09/10/2015 IMPRESSION: 1. Thrombosed Stanford B/DeBakey III dissection versus intramural hematoma in the thoracic aorta extending from just beyond the subclavian artery origin to the level of the celiac axis, as detailed above. Critical Value/emergent results were called by telephone at the time of interpretation on 09/10/2015 at 8:30 am to Dr. Brantley Stage , who verbally acknowledged these results. 2. Mild aneurysmal dilatation of the descending thoracic aorta to 3.9 cm. 3.  Mild origin stenosis of the right common iliac artery, of possible hemodynamic significance. Correlate with clinical symptomatology and ABIs.     Echo 09/14/2015 LV EF: 55% -   60%  ------------------------------------------------------------------- Indications:      441.00 Dissection of Aorta Aneurysm, any site. Chest pain 786.51.  ------------------------------------------------------------------- History:   Risk factors:  Former tobacco use. Hypertension.  ------------------------------------------------------------------- Study Conclusions  - Left ventricle: The cavity size was normal. Wall thickness was   increased in a pattern of moderate LVH. Systolic function was   normal. The estimated ejection fraction was in the range of 55%   to 60%. Wall motion was normal; there were no regional wall   motion abnormalities. Features are consistent with a pseudonormal   left ventricular filling pattern, with concomitant abnormal   relaxation and increased filling pressure (grade 2 diastolic   dysfunction).   CTA of chest 09/30/2015 1. Evolution and interval improvement of the Stanford type B aortic dissection/ intramural hematoma compared 09/10/2015. Both the maximal diameter of the proximal descending  thoracic aorta and the more distal descending thoracic aorta measure 3.6 cm today compared to 3.9 cm previously. The crescentic intramural hematoma is significantly less conspicuous. 2. Incidental note is made of a small aortic branch artery pseudoaneurysm arising from an intercostal artery at the 8 o'clock position of the descending thoracic aorta. This is a finding of no clinical significance.  ASSESSMENT:    1. Accelerated hypertension   2. Aortic dissection distal to left subclavian (HCC)      PLAN:  In order of problems listed above:  1. Type B aortic dissection  - Reemphasized the need to have strict blood pressure control, ideally given his type B aortic dissection, we'll try to keep his systolic blood pressure in the 110s to 120s range. Currently BP much better with addition of chlorthalidone. BP is a little low today. Will continue to monitor. If it gets too low may need to reduce dose. Recommend BMET in 2 weeks. He is well beta blocked.   2. CAD: Note previous cardiac cath resulting 2009. Recent echocardiogram shows normal EF. He is asymptomatic.  3. HTN: now well controlled.  4. Tobacco abuse: recommend cessation.  5. Hypercholesterolemia. I don't know why generic Crestor is so expensive. Will check formulary coverage and see if there is a lower cost alternative.     Medication Adjustments/Labs and Tests Ordered: Current medicines are reviewed at length with the patient today.  Concerns regarding medicines are outlined above.  Medication changes, Labs and Tests ordered today are listed in the Patient Instructions below. Patient Instructions  Continue your current therapy  Continue to monitor your BP closely   You should have blood work to check your kidney function and potassium in a couple of weeks.  I will see you in 6 months     Signed, Tziporah Knoke Martinique, MD  12/23/2015 8:54 AM    [facility] [address], [place], [place]   [address] Phone: [phone]; Fax: [phone]

## 2015-12-23 ENCOUNTER — Ambulatory Visit (INDEPENDENT_AMBULATORY_CARE_PROVIDER_SITE_OTHER): Payer: PRIVATE HEALTH INSURANCE | Admitting: Cardiology

## 2015-12-23 ENCOUNTER — Other Ambulatory Visit: Payer: Self-pay

## 2015-12-23 ENCOUNTER — Encounter: Payer: Self-pay | Admitting: Cardiology

## 2015-12-23 VITALS — BP 92/64 | HR 59 | Ht 71.0 in | Wt 201.6 lb

## 2015-12-23 DIAGNOSIS — I10 Essential (primary) hypertension: Secondary | ICD-10-CM | POA: Diagnosis not present

## 2015-12-23 DIAGNOSIS — I7101 Dissection of thoracic aorta: Secondary | ICD-10-CM

## 2015-12-23 DIAGNOSIS — I71019 Dissection of thoracic aorta, unspecified: Secondary | ICD-10-CM

## 2015-12-23 MED ORDER — PRAVASTATIN SODIUM 20 MG PO TABS: 20.0000 mg | ORAL_TABLET | Freq: Every evening | ORAL | 6 refills | Status: DC

## 2015-12-23 MED ORDER — ROSUVASTATIN CALCIUM 20 MG PO TABS: 20.0000 mg | ORAL_TABLET | Freq: Every day | ORAL | 6 refills | Status: DC

## 2015-12-23 NOTE — Patient Instructions (Addendum)
Continue your current therapy  Continue to monitor your BP closely   You should have blood work to check your kidney function and potassium in a couple of weeks.  I will see you in 6 months

## 2016-09-07 ENCOUNTER — Other Ambulatory Visit: Payer: Self-pay | Admitting: Surgery

## 2016-09-07 DIAGNOSIS — I71012 Dissection of descending thoracic aorta: Secondary | ICD-10-CM

## 2016-09-07 DIAGNOSIS — I7101 Dissection of thoracic aorta: Secondary | ICD-10-CM

## 2016-09-07 DIAGNOSIS — I71019 Dissection of thoracic aorta, unspecified: Secondary | ICD-10-CM

## 2016-10-03 ENCOUNTER — Ambulatory Visit
Admission: RE | Admit: 2016-10-03 | Discharge: 2016-10-03 | Disposition: A | Discharge status: Home / Self Care | Payer: Self-pay | Source: Ambulatory Visit | Attending: Surgery | Admitting: Surgery

## 2016-10-03 ENCOUNTER — Ambulatory Visit (INDEPENDENT_AMBULATORY_CARE_PROVIDER_SITE_OTHER): Payer: PRIVATE HEALTH INSURANCE | Admitting: Surgery

## 2016-10-03 ENCOUNTER — Encounter: Payer: Self-pay | Admitting: Surgery

## 2016-10-03 VITALS — BP 92/66 | HR 59 | Resp 20 | Ht 71.0 in | Wt 223.0 lb

## 2016-10-03 DIAGNOSIS — I71012 Dissection of descending thoracic aorta: Secondary | ICD-10-CM

## 2016-10-03 DIAGNOSIS — I7101 Dissection of thoracic aorta: Secondary | ICD-10-CM

## 2016-10-03 DIAGNOSIS — I71019 Dissection of thoracic aorta, unspecified: Secondary | ICD-10-CM

## 2016-10-03 IMAGING — CT CT CTA ABD/PEL W/CM AND/OR W/O CM
1 of 7 series · 11 of 46 positions shown, 17 images · IV contrast (60CC ISOVUE 370)
Comparison: Noncontrast study [DATE].  CTA chest [DATE].

CLINICAL DATA: Follow-up aortic dissection

EXAM:
CT ANGIOGRAPHY CHEST, ABDOMEN AND PELVIS
TECHNIQUE: Multidetector CT imaging through the chest, abdomen and pelvis was
performed using the standard protocol during bolus administration of
intravenous contrast. Multiplanar reconstructed images and MIPs were
obtained and reviewed to evaluate the vascular anatomy.
CONTRAST:  60 cc Isovue 370 IV

[Series 4: angio · axial · 0.86mm/px · z∈[-613,-63]mm · 11 of 265 slices shown, 17 images]
[im 23/265  soft-tissue]
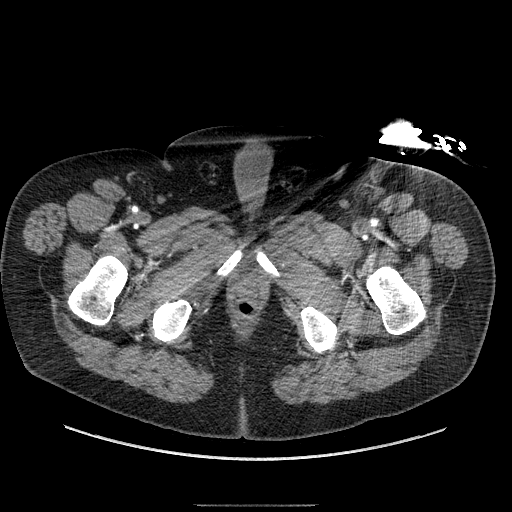
[im 23/265  bone]
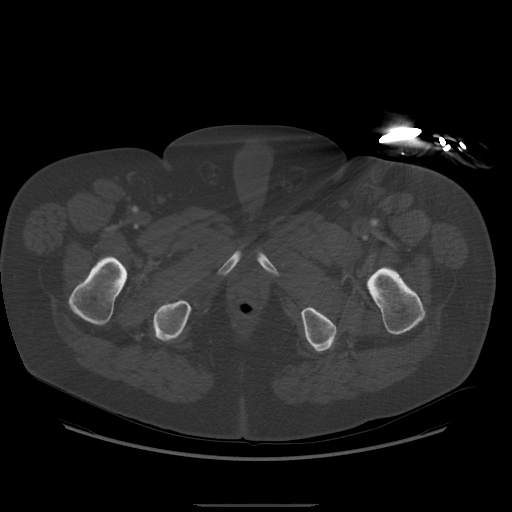
[im 45/265  soft-tissue]
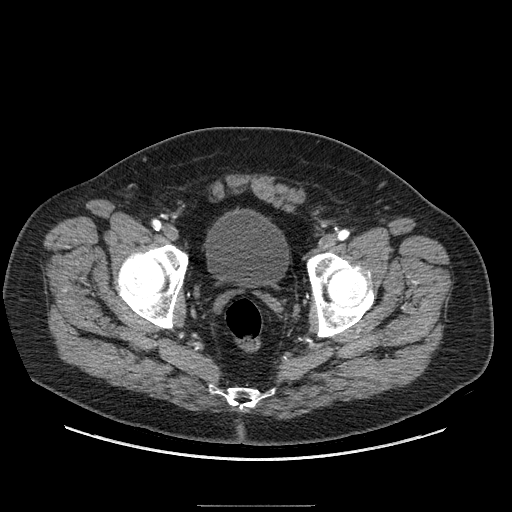
[im 67/265  soft-tissue]
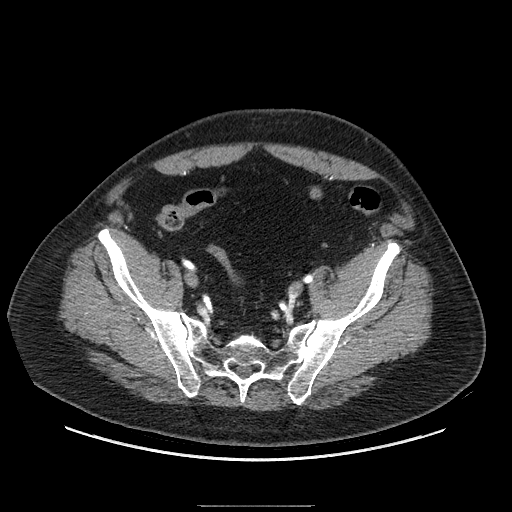
[im 89/265  soft-tissue]
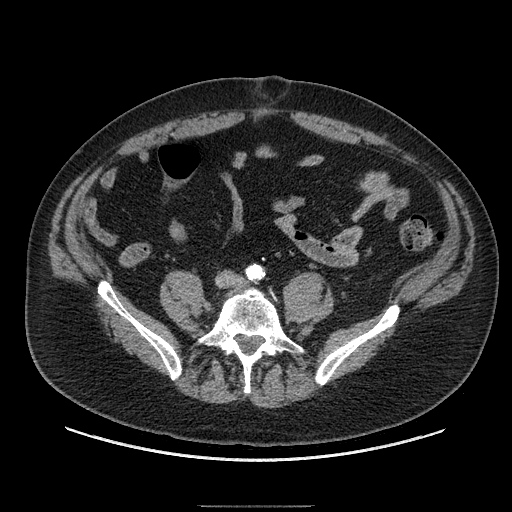
[im 111/265  soft-tissue]
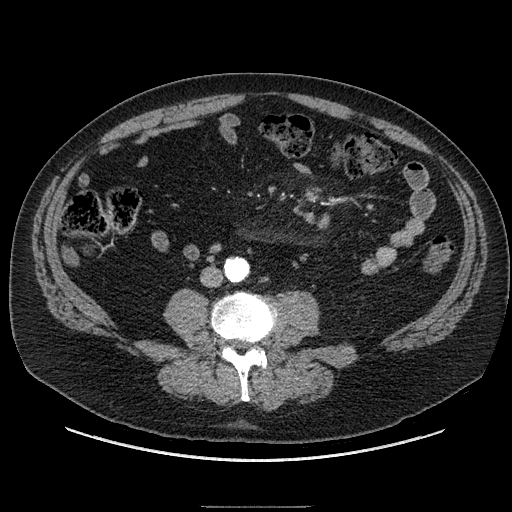
[im 133/265  soft-tissue]
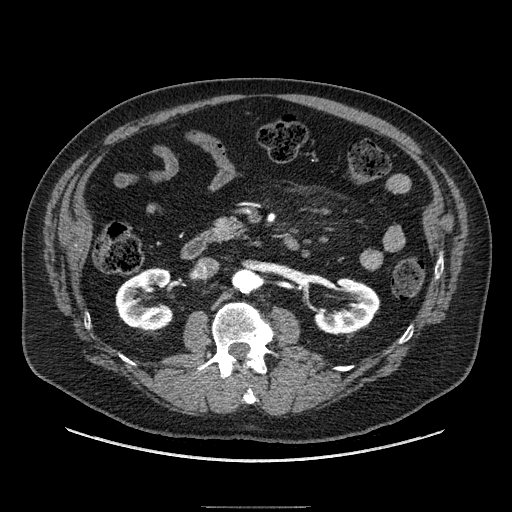
[im 155/265  soft-tissue]
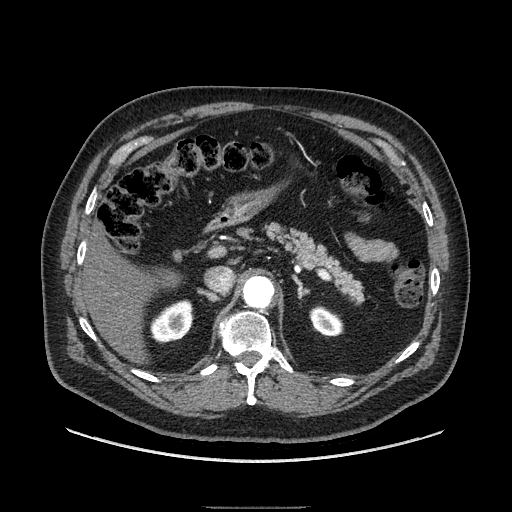
[im 177/265  soft-tissue]
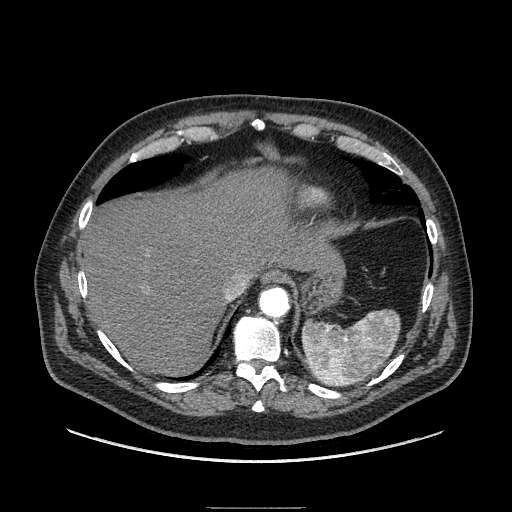
[im 177/265  lung]
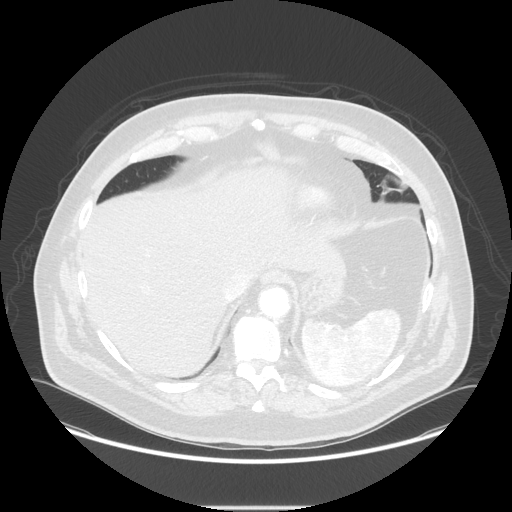
[im 199/265  soft-tissue]
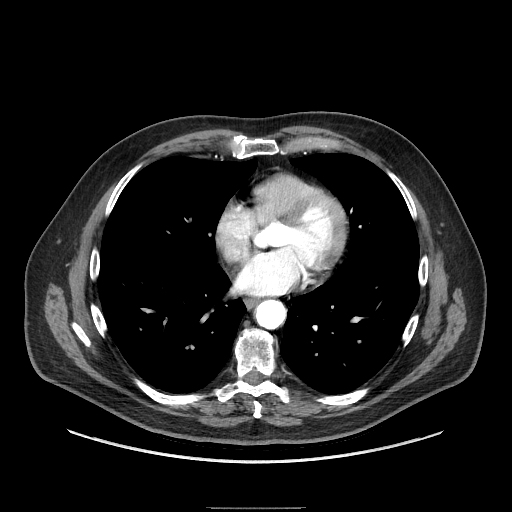
[im 199/265  lung]
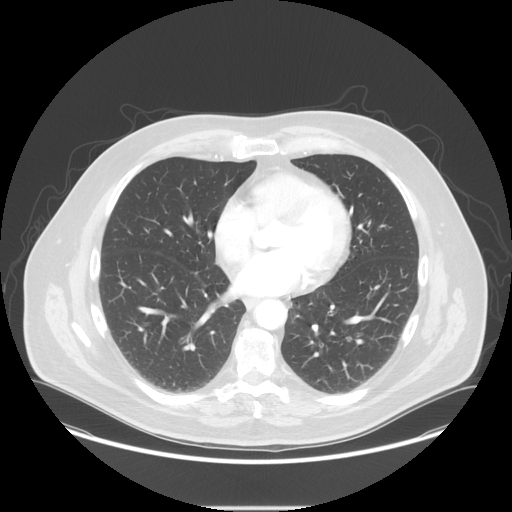
[im 199/265  bone]
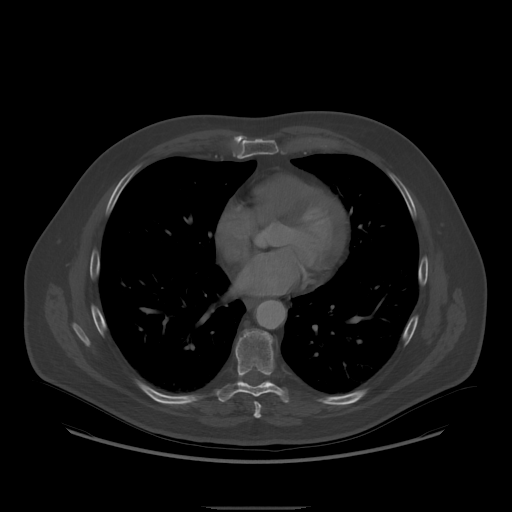
[im 221/265  soft-tissue]
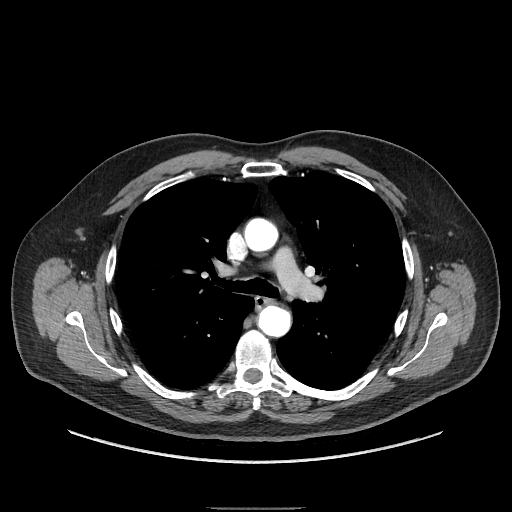
[im 221/265  lung]
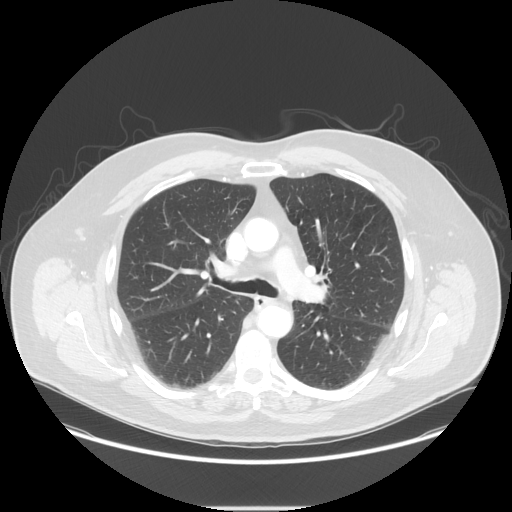
[im 243/265  soft-tissue]
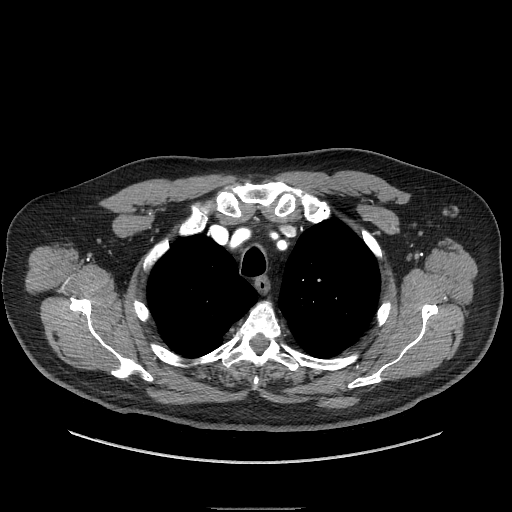
[im 243/265  lung]
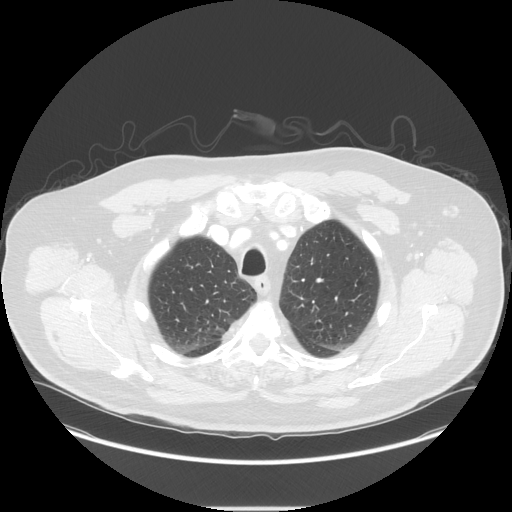

[11 of 46 positions shown; findings below may reference images not displayed]

FINDINGS: CTA CHEST FINDINGS

Cardiovascular: Proximal descending thoracic aorta measures 3.1 cm
in maximum diameter compared to 3.6 cm previously. No current
evidence for aortic dissection. Ectasia of the distal descending
thoracic aorta at the hiatus measures 3.6 cm, stable. Ascending
aorta normal caliber. Scattered aortic calcifications in the left
anterior descending coronary artery. Heart is normal size.

Mediastinum/Nodes: No mediastinal, hilar, or axillary adenopathy.

Lungs/Pleura: Lungs are clear. No focal airspace opacities or
suspicious nodules. No effusions.

Musculoskeletal: No acute bony abnormality.

Review of the MIP images confirms the above findings.

CTA ABDOMEN AND PELVIS FINDINGS

VASCULAR

Aorta: Maximum aortic diameter proximal is 3.1 cm. Maximum diameter
of the infrarenal abdominal aorta 2.2 cm. Mild atherosclerotic
calcifications. No dissection.

Celiac: Widely patent

SMA: Widely patent

Renals: Single bilaterally, widely patent

IMA: Widely patent

Inflow: Atherosclerotic change without aneurysm or dissection.

Veins: Grossly patent and unremarkable.

Review of the MIP images confirms the above findings.

NON-VASCULAR

Hepatobiliary: Fatty infiltration of the liver. No focal abnormality
or biliary ductal dilatation. Gallbladder unremarkable.

Pancreas: No focal abnormality or ductal dilatation.

Spleen: No focal abnormality.  Normal size.

Adrenals/Urinary Tract: No adrenal abnormality. No focal renal
abnormality. No stones or hydronephrosis. Urinary bladder is
unremarkable.

Stomach/Bowel: Stomach, large and small bowel grossly unremarkable.

Lymphatic: Mild haziness in the central mesentery with mildly
prominent central mesenteric lymph nodes, similar to study from
[DATE].

Reproductive: Mild central prostate calcifications.

Other: No free fluid or free air. Small bilateral inguinal hernias
and umbilical hernia containing fat.

Musculoskeletal: No acute bony abnormality.

Review of the MIP images confirms the above findings.
IMPRESSION: Further decrease in the maximum diameter of the thoracic aorta,
cm proximally compared to 3.6 cm previously. Stable mild aneurysmal
dilatation at the aortic hiatus, 3.6 cm. No dissection. Slight
ectasia of the proximal abdominal aorta with mild atherosclerotic
disease. No aneurysm or dissection.

No acute findings in the chest, abdomen or pelvis.

Stable haziness in the central mesentery with mildly prominent
mesenteric lymph nodes.

Suspect mild fatty infiltration of the liver.

## 2016-10-03 MED ORDER — IOPAMIDOL (ISOVUE-370) INJECTION 76%
60.0000 mL | Freq: Once | INTRAVENOUS | Status: AC | PRN
  Administered 2016-10-03: 60 mL via INTRAVENOUS

## 2016-10-03 NOTE — Progress Notes (Signed)
HPI:  The patient returns today for follow up of an acute type B dissection occurring on 09/10/2015. He had an uncomplicated hospital course with rapid resolution of his pain and control of his BP on IV meds transitioned to po meds. I last saw him on 10/05/2015. There was interval improvement in the chronic type B dissection with the diameter of the descending aorta decreasing from 3.9 cm to 3.6 cm with the thrombosed false lumen less conspicuous. He has continue to do well without chest or back pain. He does report fatigue and dizziness and has noted a low BP at home. He says the dizziness passes with time.  Current Outpatient Prescriptions  Medication Sig Dispense Refill  . acetaminophen (TYLENOL) 325 MG tablet Take 2 tablets (650 mg total) by mouth every 6 (six) hours as needed for mild pain or headache (back pain).    Marland Kitchen aspirin 81 MG tablet Take 81 mg by mouth daily.    . chlorthalidone (HYGROTON) 25 MG tablet Take 25 mg by mouth daily.    . Cholecalciferol (D3 HIGH POTENCY) 2000 units CAPS Take by mouth.    . cloNIDine (CATAPRES) 0.3 MG tablet Take 1 tablet (0.3 mg total) by mouth 2 (two) times daily. 180 tablet 3  . docusate sodium (COLACE) 100 MG capsule Take 100 mg by mouth 2 (two) times daily.    Marland Kitchen lisinopril (PRINIVIL,ZESTRIL) 40 MG tablet Take 1 tablet (40 mg total) by mouth daily. 90 tablet 3  . Metoprolol Tartrate 75 MG TABS Take 75 mg by mouth 2 (two) times daily. 180 tablet 3  . pantoprazole (PROTONIX) 20 MG tablet Take 20 mg by mouth daily.    . pravastatin (PRAVACHOL) 20 MG tablet Take 1 tablet (20 mg total) by mouth every evening. 30 tablet 6   No current facility-administered medications for this visit.      Physical Exam: BP 92/66 (BP Location: Right Arm, Cuff Size: Normal)   Pulse (!) 59   Resp 20   Ht 5\' 11"  (1.803 m)   Wt 223 lb (101.2 kg)   SpO2 92% Comment: RA  BMI 31.10 kg/m  He looks well Cardiac exam shows a regular rate and rhythm with normal heart  sounds Lung exam is clear There is no peripheral edema   Diagnostic Tests:  CLINICAL DATA:  Follow-up aortic dissection  EXAM: CT ANGIOGRAPHY CHEST, ABDOMEN AND PELVIS  TECHNIQUE: Multidetector CT imaging through the chest, abdomen and pelvis was performed using the standard protocol during bolus administration of intravenous contrast. Multiplanar reconstructed images and MIPs were obtained and reviewed to evaluate the vascular anatomy.  CONTRAST:  60 cc Isovue 370 IV  COMPARISON:  Noncontrast study 07/18/2016.  CTA chest 09/30/2015.  FINDINGS: CTA CHEST FINDINGS  Cardiovascular: Proximal descending thoracic aorta measures 3.1 cm in maximum diameter compared to 3.6 cm previously. No current evidence for aortic dissection. Ectasia of the distal descending thoracic aorta at the hiatus measures 3.6 cm, stable. Ascending aorta normal caliber. Scattered aortic calcifications in the left anterior descending coronary artery. Heart is normal size.  Mediastinum/Nodes: No mediastinal, hilar, or axillary adenopathy.  Lungs/Pleura: Lungs are clear. No focal airspace opacities or suspicious nodules. No effusions.  Musculoskeletal: No acute bony abnormality.  Review of the MIP images confirms the above findings.  CTA ABDOMEN AND PELVIS FINDINGS  VASCULAR  Aorta: Maximum aortic diameter proximal is 3.1 cm. Maximum diameter of the infrarenal abdominal aorta 2.2 cm. Mild atherosclerotic calcifications. No dissection.  Celiac: Widely  patent  SMA: Widely patent  Renals: Single bilaterally, widely patent  IMA: Widely patent  Inflow: Atherosclerotic change without aneurysm or dissection.  Veins: Grossly patent and unremarkable.  Review of the MIP images confirms the above findings.  NON-VASCULAR  Hepatobiliary: Fatty infiltration of the liver. No focal abnormality or biliary ductal dilatation. Gallbladder unremarkable.  Pancreas: No focal  abnormality or ductal dilatation.  Spleen: No focal abnormality.  Normal size.  Adrenals/Urinary Tract: No adrenal abnormality. No focal renal abnormality. No stones or hydronephrosis. Urinary bladder is unremarkable.  Stomach/Bowel: Stomach, large and small bowel grossly unremarkable.  Lymphatic: Mild haziness in the central mesentery with mildly prominent central mesenteric lymph nodes, similar to study from 09/10/2015.  Reproductive: Mild central prostate calcifications.  Other: No free fluid or free air. Small bilateral inguinal hernias and umbilical hernia containing fat.  Musculoskeletal: No acute bony abnormality.  Review of the MIP images confirms the above findings.  IMPRESSION: Further decrease in the maximum diameter of the thoracic aorta, 3.1 cm proximally compared to 3.6 cm previously. Stable mild aneurysmal dilatation at the aortic hiatus, 3.6 cm. No dissection. Slight ectasia of the proximal abdominal aorta with mild atherosclerotic disease. No aneurysm or dissection.  No acute findings in the chest, abdomen or pelvis.  Stable haziness in the central mesentery with mildly prominent mesenteric lymph nodes.  Suspect mild fatty infiltration of the liver.   Electronically Signed   By: Rolm Baptise M.D.   On: 10/03/2016 11:41   Impression:  There has been further decrease in the diameter of the descending thoracic aorta to 3.1 cm with stable mild aneurysmal dilation at the aortic hiatus of 3.6 cm. There is no evidence of dissection. I have recommended doing a follow up scan in one year. He has fatigue and dizziness and his BP today is on the low side. Initially it was 85/63 and repeat was 92/66. This is probably too low for him and I don't think he needs that tight of control. A BP of 120-130 would be ideal. He will discuss this with his PCP to see about decreasing his BP meds.  Plan:  Return to see me in one year with a CTA chest and  abdomen. BP follow up with Dr. Sherrie Sport.  I spent 15 minutes performing this established patient evaluation and > 50% of this time was spent face to face counseling and coordinating the care of this patient's aortic aneurysm and dissection.    Gaye Pollack, MD [facility] [phone]

## 2016-11-26 ENCOUNTER — Other Ambulatory Visit: Payer: Self-pay | Admitting: *Deleted

## 2016-11-26 MED ORDER — METOPROLOL TARTRATE 75 MG PO TABS: 75.0000 mg | ORAL_TABLET | Freq: Two times a day (BID) | ORAL | 0 refills | Status: DC

## 2016-11-30 ENCOUNTER — Telehealth: Payer: Self-pay

## 2016-11-30 MED ORDER — METOPROLOL TARTRATE 50 MG PO TABS: ORAL_TABLET | ORAL | 1 refills | Status: AC

## 2016-11-30 NOTE — Telephone Encounter (Signed)
Called patient left message on personal voice mail call office to schedule appointment with Dr.Jordan.Received a request from pharmacy out of metoprolol 75 mg.tablets.50 mg tablets, take 1&1/2 tablets twice a day sent to pharmacy.

## 2017-02-20 ENCOUNTER — Other Ambulatory Visit: Payer: Self-pay | Admitting: Cardiology

## 2017-07-08 ENCOUNTER — Other Ambulatory Visit: Payer: Self-pay | Admitting: Cardiology

## 2017-07-10 ENCOUNTER — Other Ambulatory Visit: Payer: Self-pay | Admitting: Cardiology

## 2017-09-03 DIAGNOSIS — Z6835 Body mass index (BMI) 35.0-35.9, adult: Secondary | ICD-10-CM | POA: Diagnosis not present

## 2017-09-03 DIAGNOSIS — I10 Essential (primary) hypertension: Secondary | ICD-10-CM | POA: Diagnosis not present

## 2017-09-03 DIAGNOSIS — E7849 Other hyperlipidemia: Secondary | ICD-10-CM | POA: Diagnosis not present

## 2017-09-03 DIAGNOSIS — N182 Chronic kidney disease, stage 2 (mild): Secondary | ICD-10-CM | POA: Diagnosis not present

## 2017-09-03 DIAGNOSIS — K210 Gastro-esophageal reflux disease with esophagitis: Secondary | ICD-10-CM | POA: Diagnosis not present

## 2017-09-03 DIAGNOSIS — Z6833 Body mass index (BMI) 33.0-33.9, adult: Secondary | ICD-10-CM | POA: Diagnosis not present

## 2017-09-03 DIAGNOSIS — F411 Generalized anxiety disorder: Secondary | ICD-10-CM | POA: Diagnosis not present

## 2017-09-15 DEATH — deceased

## 2017-12-04 DIAGNOSIS — F411 Generalized anxiety disorder: Secondary | ICD-10-CM | POA: Diagnosis not present

## 2017-12-04 DIAGNOSIS — B372 Candidiasis of skin and nail: Secondary | ICD-10-CM | POA: Diagnosis not present

## 2017-12-04 DIAGNOSIS — E7849 Other hyperlipidemia: Secondary | ICD-10-CM | POA: Diagnosis not present

## 2017-12-04 DIAGNOSIS — N182 Chronic kidney disease, stage 2 (mild): Secondary | ICD-10-CM | POA: Diagnosis not present

## 2017-12-04 DIAGNOSIS — K210 Gastro-esophageal reflux disease with esophagitis: Secondary | ICD-10-CM | POA: Diagnosis not present

## 2017-12-04 DIAGNOSIS — I10 Essential (primary) hypertension: Secondary | ICD-10-CM | POA: Diagnosis not present

## 2018-03-12 DIAGNOSIS — R7303 Prediabetes: Secondary | ICD-10-CM | POA: Diagnosis not present

## 2018-03-12 DIAGNOSIS — K210 Gastro-esophageal reflux disease with esophagitis: Secondary | ICD-10-CM | POA: Diagnosis not present

## 2018-03-12 DIAGNOSIS — E7849 Other hyperlipidemia: Secondary | ICD-10-CM | POA: Diagnosis not present

## 2018-03-12 DIAGNOSIS — Z0000 Encounter for general adult medical examination without abnormal findings: Secondary | ICD-10-CM | POA: Diagnosis not present

## 2018-03-12 DIAGNOSIS — N182 Chronic kidney disease, stage 2 (mild): Secondary | ICD-10-CM | POA: Diagnosis not present

## 2018-03-12 DIAGNOSIS — I10 Essential (primary) hypertension: Secondary | ICD-10-CM | POA: Diagnosis not present

## 2018-03-12 DIAGNOSIS — Z6835 Body mass index (BMI) 35.0-35.9, adult: Secondary | ICD-10-CM | POA: Diagnosis not present

## 2018-03-12 DIAGNOSIS — Z125 Encounter for screening for malignant neoplasm of prostate: Secondary | ICD-10-CM | POA: Diagnosis not present

## 2018-03-12 DIAGNOSIS — F411 Generalized anxiety disorder: Secondary | ICD-10-CM | POA: Diagnosis not present

## 2018-06-13 DIAGNOSIS — N182 Chronic kidney disease, stage 2 (mild): Secondary | ICD-10-CM | POA: Diagnosis not present

## 2018-06-13 DIAGNOSIS — E7849 Other hyperlipidemia: Secondary | ICD-10-CM | POA: Diagnosis not present

## 2018-06-13 DIAGNOSIS — I10 Essential (primary) hypertension: Secondary | ICD-10-CM | POA: Diagnosis not present

## 2018-06-13 DIAGNOSIS — K210 Gastro-esophageal reflux disease with esophagitis: Secondary | ICD-10-CM | POA: Diagnosis not present

## 2018-06-13 DIAGNOSIS — F411 Generalized anxiety disorder: Secondary | ICD-10-CM | POA: Diagnosis not present

## 2018-08-21 ENCOUNTER — Other Ambulatory Visit: Payer: Self-pay | Admitting: Surgery

## 2018-08-21 DIAGNOSIS — I712 Thoracic aortic aneurysm, without rupture, unspecified: Secondary | ICD-10-CM

## 2018-08-21 DIAGNOSIS — I7101 Dissection of thoracic aorta: Secondary | ICD-10-CM

## 2018-08-21 DIAGNOSIS — I71019 Dissection of thoracic aorta, unspecified: Secondary | ICD-10-CM

## 2018-08-21 NOTE — Progress Notes (Unsigned)
c 

## 2018-09-16 DIAGNOSIS — N182 Chronic kidney disease, stage 2 (mild): Secondary | ICD-10-CM | POA: Diagnosis not present

## 2018-09-16 DIAGNOSIS — K210 Gastro-esophageal reflux disease with esophagitis: Secondary | ICD-10-CM | POA: Diagnosis not present

## 2018-09-16 DIAGNOSIS — E7849 Other hyperlipidemia: Secondary | ICD-10-CM | POA: Diagnosis not present

## 2018-09-16 DIAGNOSIS — F411 Generalized anxiety disorder: Secondary | ICD-10-CM | POA: Diagnosis not present

## 2018-09-16 DIAGNOSIS — I10 Essential (primary) hypertension: Secondary | ICD-10-CM | POA: Diagnosis not present

## 2018-10-08 ENCOUNTER — Inpatient Hospital Stay: Admission: RE | Admit: 2018-10-08 | Payer: PRIVATE HEALTH INSURANCE | Source: Ambulatory Visit

## 2018-10-08 ENCOUNTER — Ambulatory Visit: Payer: PRIVATE HEALTH INSURANCE | Admitting: Surgery

## 2018-10-08 ENCOUNTER — Encounter: Payer: Self-pay | Admitting: Surgery

## 2018-10-12 DIAGNOSIS — Z23 Encounter for immunization: Secondary | ICD-10-CM | POA: Diagnosis not present

## 2018-12-18 DIAGNOSIS — F411 Generalized anxiety disorder: Secondary | ICD-10-CM | POA: Diagnosis not present

## 2018-12-18 DIAGNOSIS — I10 Essential (primary) hypertension: Secondary | ICD-10-CM | POA: Diagnosis not present

## 2018-12-18 DIAGNOSIS — N182 Chronic kidney disease, stage 2 (mild): Secondary | ICD-10-CM | POA: Diagnosis not present

## 2018-12-18 DIAGNOSIS — E7849 Other hyperlipidemia: Secondary | ICD-10-CM | POA: Diagnosis not present

## 2018-12-18 DIAGNOSIS — K219 Gastro-esophageal reflux disease without esophagitis: Secondary | ICD-10-CM | POA: Diagnosis not present

## 2019-03-18 DIAGNOSIS — E7849 Other hyperlipidemia: Secondary | ICD-10-CM | POA: Diagnosis not present

## 2019-03-18 DIAGNOSIS — I10 Essential (primary) hypertension: Secondary | ICD-10-CM | POA: Diagnosis not present

## 2019-03-18 DIAGNOSIS — Z0000 Encounter for general adult medical examination without abnormal findings: Secondary | ICD-10-CM | POA: Diagnosis not present

## 2019-03-18 DIAGNOSIS — N182 Chronic kidney disease, stage 2 (mild): Secondary | ICD-10-CM | POA: Diagnosis not present

## 2019-03-18 DIAGNOSIS — K219 Gastro-esophageal reflux disease without esophagitis: Secondary | ICD-10-CM | POA: Diagnosis not present

## 2019-03-24 DIAGNOSIS — Z87891 Personal history of nicotine dependence: Secondary | ICD-10-CM | POA: Diagnosis not present

## 2019-03-24 DIAGNOSIS — Z136 Encounter for screening for cardiovascular disorders: Secondary | ICD-10-CM | POA: Diagnosis not present

## 2019-06-25 DIAGNOSIS — M810 Age-related osteoporosis without current pathological fracture: Secondary | ICD-10-CM | POA: Diagnosis not present

## 2019-06-25 DIAGNOSIS — Z0389 Encounter for observation for other suspected diseases and conditions ruled out: Secondary | ICD-10-CM | POA: Diagnosis not present

## 2019-07-21 DIAGNOSIS — K219 Gastro-esophageal reflux disease without esophagitis: Secondary | ICD-10-CM | POA: Diagnosis not present

## 2019-07-21 DIAGNOSIS — I10 Essential (primary) hypertension: Secondary | ICD-10-CM | POA: Diagnosis not present

## 2019-07-21 DIAGNOSIS — Z0000 Encounter for general adult medical examination without abnormal findings: Secondary | ICD-10-CM | POA: Diagnosis not present

## 2019-07-21 DIAGNOSIS — N182 Chronic kidney disease, stage 2 (mild): Secondary | ICD-10-CM | POA: Diagnosis not present

## 2019-07-21 DIAGNOSIS — E7849 Other hyperlipidemia: Secondary | ICD-10-CM | POA: Diagnosis not present

## 2019-07-24 DIAGNOSIS — K219 Gastro-esophageal reflux disease without esophagitis: Secondary | ICD-10-CM | POA: Diagnosis not present

## 2019-07-24 DIAGNOSIS — Z0000 Encounter for general adult medical examination without abnormal findings: Secondary | ICD-10-CM | POA: Diagnosis not present

## 2019-07-24 DIAGNOSIS — E7849 Other hyperlipidemia: Secondary | ICD-10-CM | POA: Diagnosis not present

## 2019-07-24 DIAGNOSIS — I10 Essential (primary) hypertension: Secondary | ICD-10-CM | POA: Diagnosis not present

## 2019-07-24 DIAGNOSIS — R7303 Prediabetes: Secondary | ICD-10-CM | POA: Diagnosis not present

## 2019-09-22 DIAGNOSIS — Z1211 Encounter for screening for malignant neoplasm of colon: Secondary | ICD-10-CM | POA: Diagnosis not present

## 2019-09-22 DIAGNOSIS — Z1212 Encounter for screening for malignant neoplasm of rectum: Secondary | ICD-10-CM | POA: Diagnosis not present

## 2019-10-09 DIAGNOSIS — Z01818 Encounter for other preprocedural examination: Secondary | ICD-10-CM | POA: Diagnosis not present

## 2019-10-13 DIAGNOSIS — K5730 Diverticulosis of large intestine without perforation or abscess without bleeding: Secondary | ICD-10-CM | POA: Diagnosis not present

## 2019-10-13 DIAGNOSIS — K589 Irritable bowel syndrome without diarrhea: Secondary | ICD-10-CM | POA: Diagnosis not present

## 2019-10-13 DIAGNOSIS — K635 Polyp of colon: Secondary | ICD-10-CM | POA: Diagnosis not present

## 2019-10-13 DIAGNOSIS — Z1211 Encounter for screening for malignant neoplasm of colon: Secondary | ICD-10-CM | POA: Diagnosis not present

## 2019-10-13 DIAGNOSIS — I10 Essential (primary) hypertension: Secondary | ICD-10-CM | POA: Diagnosis not present

## 2019-10-13 DIAGNOSIS — D126 Benign neoplasm of colon, unspecified: Secondary | ICD-10-CM | POA: Diagnosis not present

## 2019-10-13 DIAGNOSIS — Z79899 Other long term (current) drug therapy: Secondary | ICD-10-CM | POA: Diagnosis not present

## 2019-10-13 DIAGNOSIS — Z7982 Long term (current) use of aspirin: Secondary | ICD-10-CM | POA: Diagnosis not present

## 2019-10-22 DIAGNOSIS — K219 Gastro-esophageal reflux disease without esophagitis: Secondary | ICD-10-CM | POA: Diagnosis not present

## 2019-10-22 DIAGNOSIS — E7849 Other hyperlipidemia: Secondary | ICD-10-CM | POA: Diagnosis not present

## 2019-10-22 DIAGNOSIS — N182 Chronic kidney disease, stage 2 (mild): Secondary | ICD-10-CM | POA: Diagnosis not present

## 2019-10-22 DIAGNOSIS — I10 Essential (primary) hypertension: Secondary | ICD-10-CM | POA: Diagnosis not present

## 2019-10-27 DIAGNOSIS — D124 Benign neoplasm of descending colon: Secondary | ICD-10-CM | POA: Diagnosis not present

## 2019-10-27 DIAGNOSIS — D125 Benign neoplasm of sigmoid colon: Secondary | ICD-10-CM | POA: Diagnosis not present

## 2019-10-27 DIAGNOSIS — K429 Umbilical hernia without obstruction or gangrene: Secondary | ICD-10-CM | POA: Diagnosis not present

## 2019-12-22 ENCOUNTER — Encounter (INDEPENDENT_AMBULATORY_CARE_PROVIDER_SITE_OTHER): Payer: Self-pay | Admitting: Gastroenterology

## 2020-01-21 DIAGNOSIS — Z0000 Encounter for general adult medical examination without abnormal findings: Secondary | ICD-10-CM | POA: Diagnosis not present

## 2020-01-21 DIAGNOSIS — I5031 Acute diastolic (congestive) heart failure: Secondary | ICD-10-CM | POA: Diagnosis not present

## 2020-01-21 DIAGNOSIS — E7849 Other hyperlipidemia: Secondary | ICD-10-CM | POA: Diagnosis not present

## 2020-01-21 DIAGNOSIS — N182 Chronic kidney disease, stage 2 (mild): Secondary | ICD-10-CM | POA: Diagnosis not present

## 2020-01-21 DIAGNOSIS — I10 Essential (primary) hypertension: Secondary | ICD-10-CM | POA: Diagnosis not present

## 2020-01-21 DIAGNOSIS — K219 Gastro-esophageal reflux disease without esophagitis: Secondary | ICD-10-CM | POA: Diagnosis not present

## 2020-01-21 DIAGNOSIS — R0602 Shortness of breath: Secondary | ICD-10-CM | POA: Diagnosis not present

## 2020-01-28 DIAGNOSIS — R0602 Shortness of breath: Secondary | ICD-10-CM | POA: Diagnosis not present

## 2020-02-11 DIAGNOSIS — F17210 Nicotine dependence, cigarettes, uncomplicated: Secondary | ICD-10-CM | POA: Diagnosis not present

## 2020-02-11 DIAGNOSIS — R062 Wheezing: Secondary | ICD-10-CM | POA: Diagnosis not present

## 2020-02-11 DIAGNOSIS — R0602 Shortness of breath: Secondary | ICD-10-CM | POA: Diagnosis not present

## 2020-03-16 ENCOUNTER — Other Ambulatory Visit: Payer: Self-pay

## 2020-03-16 ENCOUNTER — Encounter (INDEPENDENT_AMBULATORY_CARE_PROVIDER_SITE_OTHER): Payer: Self-pay | Admitting: Gastroenterology

## 2020-03-16 ENCOUNTER — Telehealth (INDEPENDENT_AMBULATORY_CARE_PROVIDER_SITE_OTHER): Payer: Self-pay

## 2020-03-16 ENCOUNTER — Other Ambulatory Visit (INDEPENDENT_AMBULATORY_CARE_PROVIDER_SITE_OTHER): Payer: Self-pay

## 2020-03-16 ENCOUNTER — Encounter (INDEPENDENT_AMBULATORY_CARE_PROVIDER_SITE_OTHER): Payer: Self-pay

## 2020-03-16 ENCOUNTER — Ambulatory Visit (INDEPENDENT_AMBULATORY_CARE_PROVIDER_SITE_OTHER): Payer: Medicare Other | Admitting: Gastroenterology

## 2020-03-16 DIAGNOSIS — Z86010 Personal history of colonic polyps: Secondary | ICD-10-CM

## 2020-03-16 DIAGNOSIS — Z1211 Encounter for screening for malignant neoplasm of colon: Secondary | ICD-10-CM

## 2020-03-16 MED ORDER — PEG 3350-KCL-NA BICARB-NACL 420 G PO SOLR: 4000.0000 mL | ORAL | 0 refills | Status: DC

## 2020-03-16 NOTE — Patient Instructions (Signed)
Schedule colonoscopy

## 2020-03-16 NOTE — H&P (View-Only) (Signed)
Maylon Peppers, M.D. Gastroenterology & Hepatology [facility] [address] [place], [place] [address] Primary Care Physician: Neale Burly, MD [place] [address]  Referring MD: PCP and Adelina Mings, MD  Chief Complaint: Colonic polyps  History of Present Illness: Robert Bailey is a 70 y.o. male with PMH HTN, history of MI, history of aortic dissection, who presents for evaluation of colonic polyps.  Patient states feeling well.  He denies having any complaints at the moment. The patient denies having any nausea, vomiting, fever, chills, hematochezia, melena, hematemesis, abdominal distention, abdominal pain, diarrhea, jaundice, pruritus or weight loss. Has a BM 3 times a week.  He was referred to our clinic by Dr. Adelina Mings perform colonoscopy for removal of sigmoid polyps. Based on her clinical notes from 10/27/2019, the patient underwent a colonoscopy on 10/13/2019.  He was found to have the following findings:  A digital rectal exam was performed. It was normal A 4 mm polyp was found at 75 cm proximal to the anus. The polyp was sessile. The polyp was removed with a cold biopsy forceps. Resection and retrieval were complete. A 3 mm polyp was found at 55 cm proximal to the anus. The polyp was sessile. The polyp was removed with a cold biopsy forceps. Resection and retrieval were complete. A 5 mm polyp was found at 55 cm proximal to the anus. The polyp was sessile. The polyp was removed with a piecemeal technique using a cold biopsy forceps. Resection and retrieval were complete. Four sessile polyps were found in the sigmoid colon. The polyps were 2-4 mm in size. These polyps were removed with a cold biopsy forceps. Resection and retrieval were complete. A 3 mm polyp was found in the sigmoid colon. The polyp was semi-pedunculated. The polyp was removed with a cold snare. Resection and retrieval were  complete. Many small and large-mouthed diverticula were found in the sigmoid colon. There were many more sessile hyperplastic appearing polyps in the sigmoid. Five were excised but there were too many to remove today.  Note report that one of the polyps was adenomatous.  Last [id number]  FHx: neg for any gastrointestinal/liver disease, no malignancies Social: quit smoking 4 years ago, neg alcohol or illicit drug use Surgical: no abdominal surgeries  Past Medical History: Past Medical History:  Diagnosis Date  . Arthritis   . Hypertension     Past Surgical History: Past Surgical History:  Procedure Laterality Date  . REPLACEMENT TOTAL KNEE Left 2013    Family History: Family History  Problem Relation Age of Onset  . Hypertension Mother   . Heart attack Brother 62    Social History: Social History   Tobacco Use  Smoking Status Former Smoker  . Packs/day: 1.00  . Types: Cigarettes, E-cigarettes  Smokeless Tobacco Never Used  Tobacco Comment   Quit in 2018   Social History   Substance and Sexual Activity  Alcohol Use No   Social History   Substance and Sexual Activity  Drug Use No    Allergies: Allergies  Allergen Reactions  . Penicillins Shortness Of Breath    Medications: Current Outpatient Medications  Medication Sig Dispense Refill  . acetaminophen (TYLENOL) 325 MG tablet Take 2 tablets (650 mg total) by mouth every 6 (six) hours as needed for mild pain or headache (back pain).    Marland Kitchen amLODipine (NORVASC) 5 MG tablet Take 5 mg by mouth daily.    Marland Kitchen aspirin 81  MG tablet Take 81 mg by mouth daily.    . benazepril (LOTENSIN) 40 MG tablet Take 40 mg by mouth daily.    . Cholecalciferol 50 MCG (2000 UT) CAPS Take 1,000 capsules by mouth daily.    . cloNIDine (CATAPRES) 0.3 MG tablet Take 1 tablet (0.3 mg total) by mouth 2 (two) times daily. (Patient taking differently: Take 0.2 mg by mouth daily.) 180 tablet 3  . docusate sodium (COLACE) 100 MG capsule Take  100 mg by mouth 2 (two) times daily.    . furosemide (LASIX) 20 MG tablet Take 20 mg by mouth daily.    . metoprolol tartrate (LOPRESSOR) 50 MG tablet Take 1&1/2 tablets ( 75 mg ) twice a day 90 tablet 1  . Omega-3 Fatty Acids (FISH OIL) 1000 MG CAPS Take 1,080 mg by mouth daily.    . pantoprazole (PROTONIX) 20 MG tablet Take 20 mg by mouth daily.    . pravastatin (PRAVACHOL) 20 MG tablet TAKE ONE TABLET BY MOUTH ONCE DAILY IN THE EVENING (Patient taking differently: Take 40 mg by mouth daily.) 30 tablet 0   No current facility-administered medications for this visit.    Review of Systems: GENERAL: negative for malaise, night sweats HEENT: No changes in hearing or vision, no nose bleeds or other nasal problems. NECK: Negative for lumps, goiter, pain and significant neck swelling RESPIRATORY: Negative for cough, wheezing CARDIOVASCULAR: Negative for chest pain, leg swelling, palpitations, orthopnea GI: SEE HPI MUSCULOSKELETAL: Negative for joint pain or swelling, back pain, and muscle pain. SKIN: Negative for lesions, rash PSYCH: Negative for sleep disturbance, mood disorder and recent psychosocial stressors. HEMATOLOGY Negative for prolonged bleeding, bruising easily, and swollen nodes. ENDOCRINE: Negative for cold or heat intolerance, polyuria, polydipsia and goiter. NEURO: negative for tremor, gait imbalance, syncope and seizures. The remainder of the review of systems is noncontributory.   Physical Exam: BP (!) 157/94 (BP Location: Left Arm, Patient Position: Sitting, Cuff Size: Large)   Pulse 60   Temp (!) 97.4 F (36.3 C) (Oral)   Ht 5\' 11"  (1.803 m)   Wt 255 lb (115.7 kg)   BMI 35.57 kg/m  GENERAL: The patient is AO x3, in no acute distress. HEENT: Head is normocephalic and atraumatic. EOMI are intact. Mouth is well hydrated and without lesions. NECK: Supple. No masses LUNGS: Clear to auscultation. No presence of rhonchi/wheezing/rales. Adequate chest expansion HEART:  RRR, normal s1 and s2. ABDOMEN: Soft, nontender, no guarding, no peritoneal signs, and nondistended. BS +. No masses. EXTREMITIES: Without any cyanosis, clubbing, rash, lesions or edema. NEUROLOGIC: AOx3, no focal motor deficit. SKIN: no jaundice, no rashes   Imaging/Labs: as above  I personally reviewed and interpreted the available labs, imaging and endoscopic files.  Impression and Plan: Robert Bailey is a 70 y.o. male with PMH HTN, history of MI, history of aortic dissection, who presents for evaluation of colonic polyps.  The patient had presence of one adenomatous polyp among the polyps that were removed.  However, based on the notes there were multiple polyps that were not removed during his most recent colonoscopy.  I explained to the patient that if they have a hyperplastic appearance they may not need to be removed but he may have other adenomatous polyps that will need resection.  Due to this, the patient agreed to undergo a colonoscopy.  More than 50% of the office visit was dedicated to discussing the procedure, including the day of and risks involved. Patient understands what the procedure  involves including the benefits and any risks. Patient understands alternatives to the proposed procedure. Risks including (but not limited to) bleeding, tearing of the lining (perforation), rupture of adjacent organs, problems with heart and lung function, infection, and medication reactions. A small percentage of complications may require surgery, hospitalization, repeat endoscopic procedure, and/or transfusion. A small percentage of polyps and other tumors may not be seen.  - Schedule colonoscopy  All questions were answered.      Maylon Peppers, MD Gastroenterology and Hepatology [facility]

## 2020-03-16 NOTE — Telephone Encounter (Signed)
Robert Bailey, CMA  

## 2020-03-16 NOTE — Progress Notes (Signed)
Maylon Peppers, M.D. Gastroenterology & Hepatology [facility] [address] [place], [place] [address] Primary Care Physician: Neale Burly, MD [place] [address]  Referring MD: PCP and Adelina Mings, MD  Chief Complaint: Colonic polyps  History of Present Illness: Robert Bailey is a 70 y.o. male with PMH HTN, history of MI, history of aortic dissection, who presents for evaluation of colonic polyps.  Patient states feeling well.  He denies having any complaints at the moment. The patient denies having any nausea, vomiting, fever, chills, hematochezia, melena, hematemesis, abdominal distention, abdominal pain, diarrhea, jaundice, pruritus or weight loss. Has a BM 3 times a week.  He was referred to our clinic by Dr. Adelina Mings perform colonoscopy for removal of sigmoid polyps. Based on her clinical notes from 10/27/2019, the patient underwent a colonoscopy on 10/13/2019.  He was found to have the following findings:  A digital rectal exam was performed. It was normal A 4 mm polyp was found at 75 cm proximal to the anus. The polyp was sessile. The polyp was removed with a cold biopsy forceps. Resection and retrieval were complete. A 3 mm polyp was found at 55 cm proximal to the anus. The polyp was sessile. The polyp was removed with a cold biopsy forceps. Resection and retrieval were complete. A 5 mm polyp was found at 55 cm proximal to the anus. The polyp was sessile. The polyp was removed with a piecemeal technique using a cold biopsy forceps. Resection and retrieval were complete. Four sessile polyps were found in the sigmoid colon. The polyps were 2-4 mm in size. These polyps were removed with a cold biopsy forceps. Resection and retrieval were complete. A 3 mm polyp was found in the sigmoid colon. The polyp was semi-pedunculated. The polyp was removed with a cold snare. Resection and retrieval were  complete. Many small and large-mouthed diverticula were found in the sigmoid colon. There were many more sessile hyperplastic appearing polyps in the sigmoid. Five were excised but there were too many to remove today.  Note report that one of the polyps was adenomatous.  Last WGN:[id number]  FHx: neg for any gastrointestinal/liver disease, no malignancies Social: quit smoking 4 years ago, neg alcohol or illicit drug use Surgical: no abdominal surgeries  Past Medical History: Past Medical History:  Diagnosis Date  . Arthritis   . Hypertension     Past Surgical History: Past Surgical History:  Procedure Laterality Date  . REPLACEMENT TOTAL KNEE Left 2013    Family History: Family History  Problem Relation Age of Onset  . Hypertension Mother   . Heart attack Brother 29    Social History: Social History   Tobacco Use  Smoking Status Former Smoker  . Packs/day: 1.00  . Types: Cigarettes, E-cigarettes  Smokeless Tobacco Never Used  Tobacco Comment   Quit in 2018   Social History   Substance and Sexual Activity  Alcohol Use No   Social History   Substance and Sexual Activity  Drug Use No    Allergies: Allergies  Allergen Reactions  . Penicillins Shortness Of Breath    Medications: Current Outpatient Medications  Medication Sig Dispense Refill  . acetaminophen (TYLENOL) 325 MG tablet Take 2 tablets (650 mg total) by mouth every 6 (six) hours as needed for mild pain or headache (back pain).    Marland Kitchen amLODipine (NORVASC) 5 MG tablet Take 5 mg by mouth daily.    Marland Kitchen aspirin 81  MG tablet Take 81 mg by mouth daily.    . benazepril (LOTENSIN) 40 MG tablet Take 40 mg by mouth daily.    . Cholecalciferol 50 MCG (2000 UT) CAPS Take 1,000 capsules by mouth daily.    . cloNIDine (CATAPRES) 0.3 MG tablet Take 1 tablet (0.3 mg total) by mouth 2 (two) times daily. (Patient taking differently: Take 0.2 mg by mouth daily.) 180 tablet 3  . docusate sodium (COLACE) 100 MG capsule Take  100 mg by mouth 2 (two) times daily.    . furosemide (LASIX) 20 MG tablet Take 20 mg by mouth daily.    . metoprolol tartrate (LOPRESSOR) 50 MG tablet Take 1&1/2 tablets ( 75 mg ) twice a day 90 tablet 1  . Omega-3 Fatty Acids (FISH OIL) 1000 MG CAPS Take 1,080 mg by mouth daily.    . pantoprazole (PROTONIX) 20 MG tablet Take 20 mg by mouth daily.    . pravastatin (PRAVACHOL) 20 MG tablet TAKE ONE TABLET BY MOUTH ONCE DAILY IN THE EVENING (Patient taking differently: Take 40 mg by mouth daily.) 30 tablet 0   No current facility-administered medications for this visit.    Review of Systems: GENERAL: negative for malaise, night sweats HEENT: No changes in hearing or vision, no nose bleeds or other nasal problems. NECK: Negative for lumps, goiter, pain and significant neck swelling RESPIRATORY: Negative for cough, wheezing CARDIOVASCULAR: Negative for chest pain, leg swelling, palpitations, orthopnea GI: SEE HPI MUSCULOSKELETAL: Negative for joint pain or swelling, back pain, and muscle pain. SKIN: Negative for lesions, rash PSYCH: Negative for sleep disturbance, mood disorder and recent psychosocial stressors. HEMATOLOGY Negative for prolonged bleeding, bruising easily, and swollen nodes. ENDOCRINE: Negative for cold or heat intolerance, polyuria, polydipsia and goiter. NEURO: negative for tremor, gait imbalance, syncope and seizures. The remainder of the review of systems is noncontributory.   Physical Exam: BP (!) 157/94 (BP Location: Left Arm, Patient Position: Sitting, Cuff Size: Large)   Pulse 60   Temp (!) 97.4 F (36.3 C) (Oral)   Ht 5\' 11"  (1.803 m)   Wt 255 lb (115.7 kg)   BMI 35.57 kg/m  GENERAL: The patient is AO x3, in no acute distress. HEENT: Head is normocephalic and atraumatic. EOMI are intact. Mouth is well hydrated and without lesions. NECK: Supple. No masses LUNGS: Clear to auscultation. No presence of rhonchi/wheezing/rales. Adequate chest expansion HEART:  RRR, normal s1 and s2. ABDOMEN: Soft, nontender, no guarding, no peritoneal signs, and nondistended. BS +. No masses. EXTREMITIES: Without any cyanosis, clubbing, rash, lesions or edema. NEUROLOGIC: AOx3, no focal motor deficit. SKIN: no jaundice, no rashes   Imaging/Labs: as above  I personally reviewed and interpreted the available labs, imaging and endoscopic files.  Impression and Plan: Robert Bailey is a 70 y.o. male with PMH HTN, history of MI, history of aortic dissection, who presents for evaluation of colonic polyps.  The patient had presence of one adenomatous polyp among the polyps that were removed.  However, based on the notes there were multiple polyps that were not removed during his most recent colonoscopy.  I explained to the patient that if they have a hyperplastic appearance they may not need to be removed but he may have other adenomatous polyps that will need resection.  Due to this, the patient agreed to undergo a colonoscopy.  More than 50% of the office visit was dedicated to discussing the procedure, including the day of and risks involved. Patient understands what the procedure  involves including the benefits and any risks. Patient understands alternatives to the proposed procedure. Risks including (but not limited to) bleeding, tearing of the lining (perforation), rupture of adjacent organs, problems with heart and lung function, infection, and medication reactions. A small percentage of complications may require surgery, hospitalization, repeat endoscopic procedure, and/or transfusion. A small percentage of polyps and other tumors may not be seen.  - Schedule colonoscopy  All questions were answered.      Maylon Peppers, MD Gastroenterology and Hepatology [facility]

## 2020-03-29 NOTE — Patient Instructions (Signed)
Your procedure is scheduled on: 04/01/2020  Report to [facility] at    6:45 AM.  Call this number if you have problems the morning of surgery: [phone]   Remember:              Follow Directions on the letter you received from Your Physician's office regarding the Bowel Prep              No Smoking the day of Procedure :   Take these medicines the morning of surgery with A SIP OF WATER: Amlodipine and Metorprolol   Do not wear jewelry, make-up or nail polish.    Do not bring valuables to the hospital.  Contacts, dentures or bridgework may not be worn into surgery.  .   Patients discharged the day of surgery will not be allowed to drive home.     Colonoscopy, Adult, Care After This sheet gives you information about how to care for yourself after your procedure. Your health care provider may also give you more specific instructions. If you have problems or questions, contact your health care provider. What can I expect after the procedure? After the procedure, it is common to have:  A small amount of blood in your stool for 24 hours after the procedure.  Some gas.  Mild abdominal cramping or bloating.  Follow these instructions at home: General instructions   For the first 24 hours after the procedure: ? Do not drive or use machinery. ? Do not sign important documents. ? Do not drink alcohol. ? Do your regular daily activities at a slower pace than normal. ? Eat soft, easy-to-digest foods. ? Rest often.  Take over-the-counter or prescription medicines only as told by your health care provider.  It is up to you to get the results of your procedure. Ask your health care provider, or the department performing the procedure, when your results will be ready. Relieving cramping and bloating  Try walking around when you have cramps or feel bloated.  Apply heat to your abdomen as told by your health care provider. Use a heat source that your health care provider  recommends, such as a moist heat pack or a heating pad. ? Place a towel between your skin and the heat source. ? Leave the heat on for 20-30 minutes. ? Remove the heat if your skin turns bright red. This is especially important if you are unable to feel pain, heat, or cold. You may have a greater risk of getting burned. Eating and drinking  Drink enough fluid to keep your urine clear or pale yellow.  Resume your normal diet as instructed by your health care provider. Avoid heavy or fried foods that are hard to digest.  Avoid drinking alcohol for as long as instructed by your health care provider. Contact a health care provider if:  You have blood in your stool 2-3 days after the procedure. Get help right away if:  You have more than a small spotting of blood in your stool.  You pass large blood clots in your stool.  Your abdomen is swollen.  You have nausea or vomiting.  You have a fever.  You have increasing abdominal pain that is not relieved with medicine. This information is not intended to replace advice given to you by your health care provider. Make sure you discuss any questions you have with your health care provider. Document Released: 08/16/2003 Document Revised: 09/26/2015 Document Reviewed: 03/15/2015 Elsevier Interactive Patient Education  2018 [employer] 

## 2020-03-30 ENCOUNTER — Other Ambulatory Visit (HOSPITAL_COMMUNITY)
Admission: RE | Admit: 2020-03-30 | Discharge: 2020-03-30 | Disposition: A | Discharge status: Home / Self Care | Payer: Medicare Other | Source: Ambulatory Visit | Attending: Gastroenterology | Admitting: Gastroenterology

## 2020-03-30 ENCOUNTER — Encounter (HOSPITAL_COMMUNITY)
Admission: RE | Admit: 2020-03-30 | Discharge: 2020-03-30 | Disposition: A | Discharge status: Home / Self Care | Payer: Medicare Other | Source: Ambulatory Visit | Attending: Gastroenterology | Admitting: Gastroenterology

## 2020-03-30 ENCOUNTER — Other Ambulatory Visit: Payer: Self-pay

## 2020-03-30 ENCOUNTER — Encounter (HOSPITAL_COMMUNITY): Payer: Self-pay

## 2020-03-30 DIAGNOSIS — Z01818 Encounter for other preprocedural examination: Secondary | ICD-10-CM | POA: Insufficient documentation

## 2020-03-30 DIAGNOSIS — I10 Essential (primary) hypertension: Secondary | ICD-10-CM | POA: Insufficient documentation

## 2020-03-30 DIAGNOSIS — Z20822 Contact with and (suspected) exposure to covid-19: Secondary | ICD-10-CM | POA: Insufficient documentation

## 2020-03-30 LAB — SARS CORONAVIRUS 2 (TAT 6-24 HRS): SARS Coronavirus 2: NEGATIVE

## 2020-03-30 LAB — BASIC METABOLIC PANEL
Anion gap: 12 (ref 5–15)
BUN: 16 mg/dL (ref 8–23)
CO2: 25 mmol/L (ref 22–32)
Calcium: 8.6 mg/dL — ABNORMAL LOW (ref 8.9–10.3)
Chloride: 102 mmol/L (ref 98–111)
Creatinine, Ser: 1.48 mg/dL — ABNORMAL HIGH (ref 0.61–1.24)
GFR, Estimated: 52 mL/min — ABNORMAL LOW (ref >60)
Glucose, Bld: 89 mg/dL (ref 70–99)
Potassium: 3.6 mmol/L (ref 3.5–5.1)
Sodium: 139 mmol/L (ref 135–145)

## 2020-04-01 ENCOUNTER — Ambulatory Visit (HOSPITAL_COMMUNITY)
Admission: RE | Admit: 2020-04-01 | Discharge: 2020-04-01 | Disposition: A | Discharge status: Home / Self Care | Payer: Medicare Other | Source: Ambulatory Visit | Attending: Gastroenterology | Admitting: Gastroenterology

## 2020-04-01 ENCOUNTER — Encounter (HOSPITAL_COMMUNITY): Payer: Self-pay | Admitting: Gastroenterology

## 2020-04-01 ENCOUNTER — Ambulatory Visit (HOSPITAL_COMMUNITY): Payer: Medicare Other | Admitting: Anesthesiology

## 2020-04-01 ENCOUNTER — Encounter (HOSPITAL_COMMUNITY)
Admission: RE | Disposition: A | Discharge status: Home / Self Care | Payer: Self-pay | Source: Ambulatory Visit | Attending: Gastroenterology

## 2020-04-01 ENCOUNTER — Other Ambulatory Visit: Payer: Self-pay

## 2020-04-01 DIAGNOSIS — Z09 Encounter for follow-up examination after completed treatment for conditions other than malignant neoplasm: Secondary | ICD-10-CM | POA: Diagnosis not present

## 2020-04-01 DIAGNOSIS — Z8679 Personal history of other diseases of the circulatory system: Secondary | ICD-10-CM | POA: Insufficient documentation

## 2020-04-01 DIAGNOSIS — I10 Essential (primary) hypertension: Secondary | ICD-10-CM | POA: Insufficient documentation

## 2020-04-01 DIAGNOSIS — K5730 Diverticulosis of large intestine without perforation or abscess without bleeding: Secondary | ICD-10-CM | POA: Diagnosis not present

## 2020-04-01 DIAGNOSIS — D122 Benign neoplasm of ascending colon: Secondary | ICD-10-CM | POA: Diagnosis not present

## 2020-04-01 DIAGNOSIS — Z96652 Presence of left artificial knee joint: Secondary | ICD-10-CM | POA: Diagnosis not present

## 2020-04-01 DIAGNOSIS — Z880 Allergy status to penicillin: Secondary | ICD-10-CM | POA: Diagnosis not present

## 2020-04-01 DIAGNOSIS — D125 Benign neoplasm of sigmoid colon: Secondary | ICD-10-CM

## 2020-04-01 DIAGNOSIS — D175 Benign lipomatous neoplasm of intra-abdominal organs: Secondary | ICD-10-CM

## 2020-04-01 DIAGNOSIS — Z87891 Personal history of nicotine dependence: Secondary | ICD-10-CM | POA: Insufficient documentation

## 2020-04-01 DIAGNOSIS — K635 Polyp of colon: Secondary | ICD-10-CM | POA: Diagnosis not present

## 2020-04-01 DIAGNOSIS — D1779 Benign lipomatous neoplasm of other sites: Secondary | ICD-10-CM | POA: Diagnosis not present

## 2020-04-01 DIAGNOSIS — Z79899 Other long term (current) drug therapy: Secondary | ICD-10-CM | POA: Diagnosis not present

## 2020-04-01 DIAGNOSIS — D120 Benign neoplasm of cecum: Secondary | ICD-10-CM | POA: Insufficient documentation

## 2020-04-01 DIAGNOSIS — I252 Old myocardial infarction: Secondary | ICD-10-CM | POA: Diagnosis not present

## 2020-04-01 DIAGNOSIS — Z86010 Personal history of colonic polyps: Secondary | ICD-10-CM | POA: Insufficient documentation

## 2020-04-01 DIAGNOSIS — Z8249 Family history of ischemic heart disease and other diseases of the circulatory system: Secondary | ICD-10-CM | POA: Diagnosis not present

## 2020-04-01 DIAGNOSIS — Z7982 Long term (current) use of aspirin: Secondary | ICD-10-CM | POA: Diagnosis not present

## 2020-04-01 HISTORY — DX: Acute myocardial infarction, unspecified: I21.9

## 2020-04-01 HISTORY — PX: COLONOSCOPY WITH PROPOFOL: SHX5780

## 2020-04-01 HISTORY — PX: POLYPECTOMY: SHX5525

## 2020-04-01 HISTORY — PX: BIOPSY: SHX5522

## 2020-04-01 LAB — HM COLONOSCOPY

## 2020-04-01 SURGERY — COLONOSCOPY WITH PROPOFOL
Anesthesia: General

## 2020-04-01 MED ORDER — KETAMINE HCL 10 MG/ML IJ SOLN
INTRAMUSCULAR | Status: DC | PRN
  Administered 2020-04-01: 20 mg via INTRAVENOUS

## 2020-04-01 MED ORDER — PHENYLEPHRINE 40 MCG/ML (10ML) SYRINGE FOR IV PUSH (FOR BLOOD PRESSURE SUPPORT)
PREFILLED_SYRINGE | INTRAVENOUS | Status: AC
  Filled 2020-04-01: qty 20

## 2020-04-01 MED ORDER — PROPOFOL 500 MG/50ML IV EMUL
INTRAVENOUS | Status: DC | PRN
  Administered 2020-04-01: 150 ug/kg/min via INTRAVENOUS

## 2020-04-01 MED ORDER — LACTATED RINGERS IV SOLN: INTRAVENOUS | Status: DC

## 2020-04-01 MED ORDER — PROPOFOL 10 MG/ML IV BOLUS
INTRAVENOUS | Status: AC
  Filled 2020-04-01: qty 80

## 2020-04-01 MED ORDER — KETAMINE HCL 50 MG/5ML IJ SOSY
PREFILLED_SYRINGE | INTRAMUSCULAR | Status: AC
  Filled 2020-04-01: qty 5

## 2020-04-01 MED ORDER — EPHEDRINE 5 MG/ML INJ
INTRAVENOUS | Status: AC
  Filled 2020-04-01: qty 20

## 2020-04-01 MED ORDER — PHENYLEPHRINE HCL (PRESSORS) 10 MG/ML IV SOLN
INTRAVENOUS | Status: DC | PRN
  Administered 2020-04-01: 120 ug via INTRAVENOUS

## 2020-04-01 MED ORDER — EPHEDRINE SULFATE 50 MG/ML IJ SOLN
INTRAMUSCULAR | Status: DC | PRN
  Administered 2020-04-01: 20 mg via INTRAVENOUS
  Administered 2020-04-01: 10 mg via INTRAVENOUS
  Administered 2020-04-01 (×3): 20 mg via INTRAVENOUS
  Administered 2020-04-01: 10 mg via INTRAVENOUS

## 2020-04-01 NOTE — Discharge Instructions (Signed)
You are being discharged to home.  Resume your previous diet.  We are waiting for your pathology results.  Your physician has recommended a repeat colonoscopy after studies are complete for surveillance.         Colonoscopy, Adult, Care After This sheet gives you information about how to care for yourself after your procedure. Your doctor may also give you more specific instructions. If you have problems or questions, call your doctor. What can I expect after the procedure? After the procedure, it is common to have:  A small amount of blood in your poop (stool) for 24 hours.  Some gas.  Mild cramping or bloating in your belly (abdomen). Follow these instructions at home: Eating and drinking  Drink enough fluid to keep your pee (urine) pale yellow.  Follow instructions from your doctor about what you cannot eat or drink.  Return to your normal diet as told by your doctor. Avoid heavy or fried foods that are hard to digest.   Activity  Rest as told by your doctor.  Do not sit for a long time without moving. Get up to take short walks every 1-2 hours. This is important. Ask for help if you feel weak or unsteady.  Return to your normal activities as told by your doctor. Ask your doctor what activities are safe for you. To help cramping and bloating:  Try walking around.  Put heat on your belly as told by your doctor. Use the heat source that your doctor recommends, such as a moist heat pack or a heating pad. ? Put a towel between your skin and the heat source. ? Leave the heat on for 20-30 minutes. ? Remove the heat if your skin turns bright red. This is very important if you are unable to feel pain, heat, or cold. You may have a greater risk of getting burned.   General instructions  If you were given a medicine to help you relax (sedative) during your procedure, it can affect you for many hours. Do not drive or use machinery until your doctor says that it is safe.  For  the first 24 hours after the procedure: ? Do not sign important documents. ? Do not drink alcohol. ? Do your daily activities more slowly than normal. ? Eat foods that are soft and easy to digest.  Take over-the-counter or prescription medicines only as told by your doctor.  Keep all follow-up visits as told by your doctor. This is important. Contact a doctor if:  You have blood in your poop 2-3 days after the procedure. Get help right away if:  You have more than a small amount of blood in your poop.  You see large clumps of tissue (blood clots) in your poop.  Your belly is swollen.  You feel like you may vomit (nauseous).  You vomit.  You have a fever.  You have belly pain that gets worse, and medicine does not help your pain. Summary  After the procedure, it is common to have a small amount of blood in your poop. You may also have mild cramping and bloating in your belly.  If you were given a medicine to help you relax (sedative) during your procedure, it can affect you for many hours. Do not drive or use machinery until your doctor says that it is safe.  Get help right away if you have a lot of blood in your poop, feel like you may vomit, have a fever, or have more belly  pain. This information is not intended to replace advice given to you by your health care provider. Make sure you discuss any questions you have with your health care provider. Document Revised: 11/07/2018 Document Reviewed: 07/28/2018 Elsevier Patient Education  [employer].     Colon Polyps  Colon polyps are tissue growths inside the colon, which is part of the large intestine. They are one of the types of polyps that can grow in the body. A polyp may be a round bump or a mushroom-shaped growth. You could have one polyp or more than one. Most colon polyps are noncancerous (benign). However, some colon polyps can become cancerous over time. Finding and removing the polyps early can help prevent  this. What are the causes? The exact cause of colon polyps is not known. What increases the risk? The following factors may make you more likely to develop this condition:  Having a family history of colorectal cancer or colon polyps.  Being older than 68 years of age.  Being younger than 68 years of age and having a significant family history of colorectal cancer or colon polyps or a genetic condition that puts you at higher risk of getting colon polyps.  Having inflammatory bowel disease, such as ulcerative colitis or Crohn's disease.  Having certain conditions passed from parent to child (hereditary conditions), such as: ? Familial adenomatous polyposis (FAP). ? Lynch syndrome. ? Turcot syndrome. ? Peutz-Jeghers syndrome. ? MUTYH-associated polyposis (MAP).  Being overweight.  Certain lifestyle factors. These include smoking cigarettes, drinking too much alcohol, not getting enough exercise, and eating a diet that is high in fat and red meat and low in fiber.  Having had childhood cancer that was treated with radiation of the abdomen. What are the signs or symptoms? Many times, there are no symptoms. If you have symptoms, they may include:  Blood coming from the rectum during a bowel movement.  Blood in the stool (feces). The blood may be bright red or very dark in color.  Pain in the abdomen.  A change in bowel habits, such as constipation or diarrhea. How is this diagnosed? This condition is diagnosed with a colonoscopy. This is a procedure in which a lighted, flexible scope is inserted into the opening between the buttocks (anus) and then passed into the colon to examine the area. Polyps are sometimes found when a colonoscopy is done as part of routine cancer screening tests. How is this treated? This condition is treated by removing any polyps that are found. Most polyps can be removed during a colonoscopy. Those polyps will then be tested for cancer. Additional  treatment may be needed depending on the results of testing. Follow these instructions at home: Eating and drinking  Eat foods that are high in fiber, such as fruits, vegetables, and whole grains.  Eat foods that are high in calcium and vitamin D, such as milk, cheese, yogurt, eggs, liver, fish, and broccoli.  Limit foods that are high in fat, such as fried foods and desserts.  Limit the amount of red meat, precooked or cured meat, or other processed meat that you eat, such as hot dogs, sausages, bacon, or meat loaves.  Limit sugary drinks.   Lifestyle  Maintain a healthy weight, or lose weight if recommended by your health care provider.  Exercise every day or as told by your health care provider.  Do not use any products that contain nicotine or tobacco, such as cigarettes, e-cigarettes, and chewing tobacco. If you need help  quitting, ask your health care provider.  Do not drink alcohol if: ? Your health care provider tells you not to drink. ? You are pregnant, may be pregnant, or are planning to become pregnant.  If you drink alcohol: ? Limit how much you use to:  0-1 drink a day for women.  0-2 drinks a day for men. ? Know how much alcohol is in your drink. In the [place] one drink equals one 12 oz bottle of beer (355 mL), one 5 oz glass of wine (148 mL), or one 1 oz glass of hard liquor (44 mL). General instructions  Take over-the-counter and prescription medicines only as told by your health care provider.  Keep all follow-up visits. This is important. This includes having regularly scheduled colonoscopies. Talk to your health care provider about when you need a colonoscopy. Contact a health care provider if:  You have new or worsening bleeding during a bowel movement.  You have new or increased blood in your stool.  You have a change in bowel habits.  You lose weight for no known reason. Summary  Colon polyps are tissue growths inside the colon, which is part of  the large intestine. They are one type of polyp that can grow in the body.  Most colon polyps are noncancerous (benign), but some can become cancerous over time.  This condition is diagnosed with a colonoscopy.  This condition is treated by removing any polyps that are found. Most polyps can be removed during a colonoscopy. This information is not intended to replace advice given to you by your health care provider. Make sure you discuss any questions you have with your health care provider. Document Revised: 04/22/2019 Document Reviewed: 04/22/2019 Elsevier Patient Education  2021 [place] After This sheet gives you information about how to care for yourself after your procedure. Your health care provider may also give you more specific instructions. If you have problems or questions, contact your health care provider. What can I expect after the procedure? After the procedure, it is common to have:  Tiredness.  Forgetfulness about what happened after the procedure.  Impaired judgment for important decisions.  Nausea or vomiting.  Some difficulty with balance. Follow these instructions at home: For the time period you were told by your health care provider:  Rest as needed.  Do not participate in activities where you could fall or become injured.  Do not drive or use machinery.  Do not drink alcohol.  Do not take sleeping pills or medicines that cause drowsiness.  Do not make important decisions or sign legal documents.  Do not take care of children on your own.      Eating and drinking  Follow the diet that is recommended by your health care provider.  Drink enough fluid to keep your urine pale yellow.  If you vomit: ? Drink water, juice, or soup when you can drink without vomiting. ? Make sure you have little or no nausea before eating solid foods. General instructions  Have a responsible adult stay with you for the  time you are told. It is important to have someone help care for you until you are awake and alert.  Take over-the-counter and prescription medicines only as told by your health care provider.  If you have sleep apnea, surgery and certain medicines can increase your risk for breathing problems. Follow instructions from your health care provider about wearing your sleep device: ? Anytime  you are sleeping, including during daytime naps. ? While taking prescription pain medicines, sleeping medicines, or medicines that make you drowsy.  Avoid smoking.  Keep all follow-up visits as told by your health care provider. This is important. Contact a health care provider if:  You keep feeling nauseous or you keep vomiting.  You feel light-headed.  You are still sleepy or having trouble with balance after 24 hours.  You develop a rash.  You have a fever.  You have redness or swelling around the IV site. Get help right away if:  You have trouble breathing.  You have new-onset confusion at home. Summary  For several hours after your procedure, you may feel tired. You may also be forgetful and have poor judgment.  Have a responsible adult stay with you for the time you are told. It is important to have someone help care for you until you are awake and alert.  Rest as told. Do not drive or operate machinery. Do not drink alcohol or take sleeping pills.  Get help right away if you have trouble breathing, or if you suddenly become confused. This information is not intended to replace advice given to you by your health care provider. Make sure you discuss any questions you have with your health care provider. Document Revised: 09/17/2019 Document Reviewed: 12/04/2018 Elsevier Patient Education  2021 [employer].

## 2020-04-01 NOTE — Anesthesia Preprocedure Evaluation (Signed)
Anesthesia Evaluation  Patient identified by MRN, date of birth, ID band Patient awake    Reviewed: Allergy & Precautions, NPO status , Patient's Chart, lab work & pertinent test results  History of Anesthesia Complications Negative for: history of anesthetic complications  Airway Mallampati: II  TM Distance: >3 FB Neck ROM: Full    Dental  (+) Edentulous Upper, Edentulous Lower   Pulmonary shortness of breath and with exertion, former smoker,    breath sounds clear to auscultation       Cardiovascular Exercise Tolerance: Good hypertension, Pt. on medications + Past MI (aortic dissection)  Normal cardiovascular exam Rhythm:Regular Rate:Bradycardia  IMPRESSION: Further decrease in the maximum diameter of the thoracic aorta, 3.1 cm proximally compared to 3.6 cm previously. Stable mild aneurysmal dilatation at the aortic hiatus, 3.6 cm. No dissection. Slight ectasia of the proximal abdominal aorta with mild atherosclerotic disease. No aneurysm or dissection.  No acute findings in the chest, abdomen or pelvis.  Stable haziness in the central mesentery with mildly prominent mesenteric lymph nodes.  Suspect mild fatty infiltration of the liver.   Electronically Signed   By: Rolm Baptise M.D.   On: 10/03/2016 11:41   Neuro/Psych negative neurological ROS  negative psych ROS   GI/Hepatic negative GI ROS, Neg liver ROS,   Endo/Other  negative endocrine ROS  Renal/GU negative Renal ROS     Musculoskeletal  (+) Arthritis ,   Abdominal   Peds  Hematology negative hematology ROS (+)   Anesthesia Other Findings   Reproductive/Obstetrics                             Anesthesia Physical Anesthesia Plan  ASA: III  Anesthesia Plan: General   Post-op Pain Management:    Induction: Intravenous  PONV Risk Score and Plan: Propofol infusion  Airway Management Planned: Nasal Cannula  and Natural Airway  Additional Equipment:   Intra-op Plan:   Post-operative Plan:   Informed Consent: I have reviewed the patients History and Physical, chart, labs and discussed the procedure including the risks, benefits and alternatives for the proposed anesthesia with the patient or authorized representative who has indicated his/her understanding and acceptance.     Dental advisory given  Plan Discussed with: CRNA and Surgeon  Anesthesia Plan Comments:         Anesthesia Quick Evaluation

## 2020-04-01 NOTE — Interval H&P Note (Signed)
History and Physical Interval Note:  04/01/2020 7:32 AM Robert Bailey is a 70 y.o. male with PMH HTN, history of MI, history of aortic dissection, who presents for evaluation of colonic polyps.  Last colonoscopy on 10/13/2019, had multiple polyps removed, only one was a tubular adenoma.  However there were still polyps in his colon and the patient was referred for hospital to undergo resection of remaining polyps.  Patient has been completely asymptomatic.  Does not have any complaints such as nausea, vomiting, fever, chills, hematochezia, melena, hematemesis, abdominal distention, abdominal pain, diarrhea, jaundice, pruritus or weight loss.  BP (!) 141/78   Temp 98 F (36.7 C) (Oral)   Resp 12   Ht 5\' 10"  (1.778 m)   Wt 113.4 kg   SpO2 100%   BMI 35.87 kg/m  GENERAL: The patient is AO x3, in no acute distress. HEENT: Head is normocephalic and atraumatic. EOMI are intact. Mouth is well hydrated and without lesions. NECK: Supple. No masses LUNGS: Clear to auscultation. No presence of rhonchi/wheezing/rales. Adequate chest expansion HEART: RRR, normal s1 and s2. ABDOMEN: Soft, nontender, no guarding, no peritoneal signs, and nondistended. BS +. No masses. EXTREMITIES: Without any cyanosis, clubbing, rash, lesions or edema. NEUROLOGIC: AOx3, no focal motor deficit. SKIN: no jaundice, no rashes   Robert Bailey  has presented today for surgery, with the diagnosis of History of colon polyps.  The various methods of treatment have been discussed with the patient and family. After consideration of risks, benefits and other options for treatment, the patient has consented to  Procedure(s) with comments: COLONOSCOPY WITH PROPOFOL (N/A) - AM as a surgical intervention.  The patient's history has been reviewed, patient examined, no change in status, stable for surgery.  I have reviewed the patient's chart and labs.  Questions were answered to the patient's satisfaction.     Maylon Peppers  [place]

## 2020-04-01 NOTE — Op Note (Signed)
[facility] Patient Name: Robert Bailey Procedure Date: 04/01/2020 7:25 AM MRN: [id number] Date of Birth: 1950-09-12 Attending MD: Maylon Peppers ,  CSN: [id number] Age: 70 Admit Type: Outpatient Procedure:                Colonoscopy Indications:              High risk colon cancer surveillance: Personal                            history of colonic polyps Providers:                Maylon Peppers, Janeece Riggers, RN, Raphael Gibney,                            Technician Referring MD:              Medicines:                Monitored Anesthesia Care Complications:            No immediate complications. Estimated Blood Loss:     Estimated blood loss: none. Procedure:                Pre-Anesthesia Assessment:                           - Prior to the procedure, a History and Physical                            was performed, and patient medications, allergies                            and sensitivities were reviewed. The patient's                            tolerance of previous anesthesia was reviewed.                           - The risks and benefits of the procedure and the                            sedation options and risks were discussed with the                            patient. All questions were answered and informed                            consent was obtained.                           - ASA Grade Assessment: II - A patient with mild                            systemic disease.                           After obtaining informed consent, the colonoscope  was passed under direct vision. Throughout the                            procedure, the patient's blood pressure, pulse, and                            oxygen saturations were monitored continuously. The                            [id number]-[id number] ([id number]) scope was introduced through                            the anus and advanced to the the cecum, identified                            by  appendiceal orifice and ileocecal valve. The                            colonoscopy was performed without difficulty. The                            patient tolerated the procedure well. The quality                            of the bowel preparation was good. Scope withdrawal                            time was 16 minutes. Scope In: 7:40:25 AM Scope Out: 8:11:37 AM Scope Withdrawal Time: 0 hours 22 minutes 56 seconds  Total Procedure Duration: 0 hours 31 minutes 12 seconds  Findings:      The perianal and digital rectal examinations were normal.      Two sessile polyps were found in the proximal ascending colon and cecum.       The polyps were 1 to 2 mm in size. These polyps were removed with a cold       biopsy forceps. Resection and retrieval were complete.      A 5 mm polyp was found in the ascending colon. The polyp was sessile.       The polyp was removed with a cold snare. Resection and retrieval were       complete.      A 4 mm polyp was found in the sigmoid colon. The polyp was semi-sessile.       These polyps had a hyperplastic appearance - not removed.      There was a small lipoma, in the distal ascending colon.      A few small-mouthed diverticula were found in the sigmoid colon.      The retroflexed view of the distal rectum and anal verge was normal and       showed no anal or rectal abnormalities. Impression:               - Two 1 to 2 mm polyps in the proximal ascending                            colon and in the  cecum, removed with a cold biopsy                            forceps. Resected and retrieved.                           - One 5 mm polyp in the ascending colon, removed                            with a cold snare. Resected and retrieved.                           - One 4 mm polyp in the sigmoid colon.                           - Small lipoma in the distal ascending colon.                           - Diverticulosis in the sigmoid colon.                            - The distal rectum and anal verge are normal on                            retroflexion view. Moderate Sedation:      Per Anesthesia Care Recommendation:           - Discharge patient to home (ambulatory).                           - Resume previous diet.                           - Await pathology results.                           - Repeat colonoscopy after studies are complete for                            surveillance. Procedure Code(s):        --- Professional ---                           [phone], Colonoscopy, flexible; with removal of                            tumor(s), polyp(s), or other lesion(s) by snare                            technique                           45380, 59, Colonoscopy, flexible; with biopsy,                            single or multiple Diagnosis Code(s):        ---  Professional ---                           K63.5, Polyp of colon                           Z86.010, Personal history of colonic polyps                           D17.5, Benign lipomatous neoplasm of                            intra-abdominal organs                           K57.30, Diverticulosis of large intestine without                            perforation or abscess without bleeding CPT copyright 2019 American Medical Association. All rights reserved. The codes documented in this report are preliminary and upon coder review may  be revised to meet current compliance requirements. Maylon Peppers, MD Maylon Peppers,  04/01/2020 8:25:29 AM This report has been signed electronically. Number of Addenda: 0

## 2020-04-01 NOTE — Anesthesia Postprocedure Evaluation (Signed)
Anesthesia Post Note  Patient: Robert Bailey  Procedure(s) Performed: COLONOSCOPY WITH PROPOFOL (N/A ) BIOPSY POLYPECTOMY  Patient location during evaluation: PACU Anesthesia Type: General Level of consciousness: awake, oriented, awake and alert and patient cooperative Pain management: satisfactory to patient Vital Signs Assessment: post-procedure vital signs reviewed and stable Respiratory status: spontaneous breathing, respiratory function stable and nonlabored ventilation Cardiovascular status: stable Postop Assessment: no apparent nausea or vomiting Anesthetic complications: no   No complications documented.   Last Vitals:  Vitals:   04/01/20 0718 04/01/20 0818  BP: (!) 141/78   Pulse:  73  Resp: 12 16  Temp: 36.7 C   SpO2: 100%     Last Pain:  Vitals:   04/01/20 0804  TempSrc:   PainSc: 0-No pain                 Onnie Alatorre

## 2020-04-01 NOTE — Transfer of Care (Signed)
Immediate Anesthesia Transfer of Care Note  Patient: Robert Bailey  Procedure(s) Performed: COLONOSCOPY WITH PROPOFOL (N/A ) BIOPSY POLYPECTOMY  Patient Location: PACU  Anesthesia Type:General  Level of Consciousness: awake, alert , oriented and patient cooperative  Airway & Oxygen Therapy: Patient Spontanous Breathing  Post-op Assessment: Report given to RN, Post -op Vital signs reviewed and stable and Patient moving all extremities  Post vital signs: Reviewed and stable  Last Vitals:  Vitals Value Taken Time  BP    Temp    Pulse    Resp 16 04/01/20 0817  SpO2    Vitals shown include unvalidated device data.  Last Pain:  Vitals:   04/01/20 0804  TempSrc:   PainSc: 0-No pain      Patients Stated Pain Goal: 2 (81/01/75 1025)  Complications: No complications documented.

## 2020-04-04 LAB — SURGICAL PATHOLOGY

## 2020-04-05 ENCOUNTER — Encounter (INDEPENDENT_AMBULATORY_CARE_PROVIDER_SITE_OTHER): Payer: Self-pay | Admitting: *Deleted

## 2020-04-11 ENCOUNTER — Encounter (HOSPITAL_COMMUNITY): Payer: Self-pay | Admitting: Gastroenterology

## 2020-04-19 DIAGNOSIS — K219 Gastro-esophageal reflux disease without esophagitis: Secondary | ICD-10-CM | POA: Diagnosis not present

## 2020-04-19 DIAGNOSIS — N182 Chronic kidney disease, stage 2 (mild): Secondary | ICD-10-CM | POA: Diagnosis not present

## 2020-04-19 DIAGNOSIS — E7849 Other hyperlipidemia: Secondary | ICD-10-CM | POA: Diagnosis not present

## 2020-04-19 DIAGNOSIS — I10 Essential (primary) hypertension: Secondary | ICD-10-CM | POA: Diagnosis not present

## 2020-04-19 DIAGNOSIS — Z0000 Encounter for general adult medical examination without abnormal findings: Secondary | ICD-10-CM | POA: Diagnosis not present

## 2020-04-19 DIAGNOSIS — J4520 Mild intermittent asthma, uncomplicated: Secondary | ICD-10-CM | POA: Diagnosis not present

## 2020-04-19 DIAGNOSIS — I2510 Atherosclerotic heart disease of native coronary artery without angina pectoris: Secondary | ICD-10-CM | POA: Diagnosis not present

## 2020-04-19 DIAGNOSIS — I5032 Chronic diastolic (congestive) heart failure: Secondary | ICD-10-CM | POA: Diagnosis not present

## 2020-05-14 DIAGNOSIS — I10 Essential (primary) hypertension: Secondary | ICD-10-CM | POA: Diagnosis not present

## 2020-05-14 DIAGNOSIS — E7849 Other hyperlipidemia: Secondary | ICD-10-CM | POA: Diagnosis not present

## 2020-05-14 DIAGNOSIS — N182 Chronic kidney disease, stage 2 (mild): Secondary | ICD-10-CM | POA: Diagnosis not present

## 2020-06-13 DIAGNOSIS — N182 Chronic kidney disease, stage 2 (mild): Secondary | ICD-10-CM | POA: Diagnosis not present

## 2020-06-13 DIAGNOSIS — K219 Gastro-esophageal reflux disease without esophagitis: Secondary | ICD-10-CM | POA: Diagnosis not present

## 2020-06-13 DIAGNOSIS — E7849 Other hyperlipidemia: Secondary | ICD-10-CM | POA: Diagnosis not present

## 2020-06-13 DIAGNOSIS — I10 Essential (primary) hypertension: Secondary | ICD-10-CM | POA: Diagnosis not present

## 2020-07-06 DIAGNOSIS — M545 Low back pain, unspecified: Secondary | ICD-10-CM | POA: Diagnosis not present

## 2020-07-06 DIAGNOSIS — M5459 Other low back pain: Secondary | ICD-10-CM | POA: Diagnosis not present

## 2020-07-14 DIAGNOSIS — K219 Gastro-esophageal reflux disease without esophagitis: Secondary | ICD-10-CM | POA: Diagnosis not present

## 2020-07-14 DIAGNOSIS — I10 Essential (primary) hypertension: Secondary | ICD-10-CM | POA: Diagnosis not present

## 2020-07-14 DIAGNOSIS — M5459 Other low back pain: Secondary | ICD-10-CM | POA: Diagnosis not present

## 2020-07-27 DIAGNOSIS — R1031 Right lower quadrant pain: Secondary | ICD-10-CM | POA: Diagnosis not present

## 2020-07-27 DIAGNOSIS — I10 Essential (primary) hypertension: Secondary | ICD-10-CM | POA: Diagnosis not present

## 2020-08-03 DIAGNOSIS — I700 Atherosclerosis of aorta: Secondary | ICD-10-CM | POA: Diagnosis not present

## 2020-08-03 DIAGNOSIS — K429 Umbilical hernia without obstruction or gangrene: Secondary | ICD-10-CM | POA: Diagnosis not present

## 2020-08-03 DIAGNOSIS — R1031 Right lower quadrant pain: Secondary | ICD-10-CM | POA: Diagnosis not present

## 2020-09-14 DIAGNOSIS — K219 Gastro-esophageal reflux disease without esophagitis: Secondary | ICD-10-CM | POA: Diagnosis not present

## 2020-09-14 DIAGNOSIS — I10 Essential (primary) hypertension: Secondary | ICD-10-CM | POA: Diagnosis not present

## 2020-09-14 DIAGNOSIS — E7849 Other hyperlipidemia: Secondary | ICD-10-CM | POA: Diagnosis not present

## 2020-11-17 DIAGNOSIS — I700 Atherosclerosis of aorta: Secondary | ICD-10-CM | POA: Diagnosis not present

## 2020-11-17 DIAGNOSIS — Z79899 Other long term (current) drug therapy: Secondary | ICD-10-CM | POA: Diagnosis not present

## 2020-11-17 DIAGNOSIS — I10 Essential (primary) hypertension: Secondary | ICD-10-CM | POA: Diagnosis not present

## 2020-11-17 DIAGNOSIS — E782 Mixed hyperlipidemia: Secondary | ICD-10-CM | POA: Diagnosis not present

## 2020-11-17 DIAGNOSIS — I5022 Chronic systolic (congestive) heart failure: Secondary | ICD-10-CM | POA: Diagnosis not present

## 2021-02-16 DIAGNOSIS — Z0000 Encounter for general adult medical examination without abnormal findings: Secondary | ICD-10-CM | POA: Diagnosis not present

## 2021-02-16 DIAGNOSIS — I5022 Chronic systolic (congestive) heart failure: Secondary | ICD-10-CM | POA: Diagnosis not present

## 2021-02-16 DIAGNOSIS — I700 Atherosclerosis of aorta: Secondary | ICD-10-CM | POA: Diagnosis not present

## 2021-02-16 DIAGNOSIS — I10 Essential (primary) hypertension: Secondary | ICD-10-CM | POA: Diagnosis not present

## 2021-06-15 DIAGNOSIS — N182 Chronic kidney disease, stage 2 (mild): Secondary | ICD-10-CM | POA: Diagnosis not present

## 2021-06-15 DIAGNOSIS — I700 Atherosclerosis of aorta: Secondary | ICD-10-CM | POA: Diagnosis not present

## 2021-06-15 DIAGNOSIS — I10 Essential (primary) hypertension: Secondary | ICD-10-CM | POA: Diagnosis not present

## 2021-06-15 DIAGNOSIS — E7849 Other hyperlipidemia: Secondary | ICD-10-CM | POA: Diagnosis not present

## 2021-06-15 DIAGNOSIS — Z0000 Encounter for general adult medical examination without abnormal findings: Secondary | ICD-10-CM | POA: Diagnosis not present

## 2021-06-15 DIAGNOSIS — Z131 Encounter for screening for diabetes mellitus: Secondary | ICD-10-CM | POA: Diagnosis not present

## 2021-06-15 DIAGNOSIS — I5021 Acute systolic (congestive) heart failure: Secondary | ICD-10-CM | POA: Diagnosis not present

## 2021-10-25 DIAGNOSIS — N182 Chronic kidney disease, stage 2 (mild): Secondary | ICD-10-CM | POA: Diagnosis not present

## 2021-10-25 DIAGNOSIS — I10 Essential (primary) hypertension: Secondary | ICD-10-CM | POA: Diagnosis not present

## 2021-10-25 DIAGNOSIS — I700 Atherosclerosis of aorta: Secondary | ICD-10-CM | POA: Diagnosis not present

## 2021-10-25 DIAGNOSIS — E7849 Other hyperlipidemia: Secondary | ICD-10-CM | POA: Diagnosis not present

## 2021-10-25 DIAGNOSIS — N1831 Chronic kidney disease, stage 3a: Secondary | ICD-10-CM | POA: Diagnosis not present

## 2021-10-25 DIAGNOSIS — I5032 Chronic diastolic (congestive) heart failure: Secondary | ICD-10-CM | POA: Diagnosis not present

## 2022-02-26 DIAGNOSIS — I700 Atherosclerosis of aorta: Secondary | ICD-10-CM | POA: Diagnosis not present

## 2022-02-26 DIAGNOSIS — Z0000 Encounter for general adult medical examination without abnormal findings: Secondary | ICD-10-CM | POA: Diagnosis not present

## 2022-02-26 DIAGNOSIS — I5032 Chronic diastolic (congestive) heart failure: Secondary | ICD-10-CM | POA: Diagnosis not present

## 2022-02-26 DIAGNOSIS — N1831 Chronic kidney disease, stage 3a: Secondary | ICD-10-CM | POA: Diagnosis not present

## 2022-02-26 DIAGNOSIS — E7849 Other hyperlipidemia: Secondary | ICD-10-CM | POA: Diagnosis not present

## 2022-02-26 DIAGNOSIS — I10 Essential (primary) hypertension: Secondary | ICD-10-CM | POA: Diagnosis not present

## 2022-03-19 DIAGNOSIS — I4891 Unspecified atrial fibrillation: Secondary | ICD-10-CM | POA: Diagnosis not present

## 2022-03-19 DIAGNOSIS — R0600 Dyspnea, unspecified: Secondary | ICD-10-CM | POA: Diagnosis not present

## 2022-03-20 DIAGNOSIS — R0600 Dyspnea, unspecified: Secondary | ICD-10-CM | POA: Diagnosis not present

## 2022-07-09 DIAGNOSIS — I10 Essential (primary) hypertension: Secondary | ICD-10-CM | POA: Diagnosis not present

## 2022-07-09 DIAGNOSIS — N1831 Chronic kidney disease, stage 3a: Secondary | ICD-10-CM | POA: Diagnosis not present

## 2022-07-09 DIAGNOSIS — Z0000 Encounter for general adult medical examination without abnormal findings: Secondary | ICD-10-CM | POA: Diagnosis not present

## 2022-07-09 DIAGNOSIS — I5032 Chronic diastolic (congestive) heart failure: Secondary | ICD-10-CM | POA: Diagnosis not present

## 2022-07-09 DIAGNOSIS — I700 Atherosclerosis of aorta: Secondary | ICD-10-CM | POA: Diagnosis not present

## 2022-07-09 DIAGNOSIS — E7849 Other hyperlipidemia: Secondary | ICD-10-CM | POA: Diagnosis not present

## 2022-10-08 DIAGNOSIS — N1831 Chronic kidney disease, stage 3a: Secondary | ICD-10-CM | POA: Diagnosis not present

## 2022-10-08 DIAGNOSIS — I5032 Chronic diastolic (congestive) heart failure: Secondary | ICD-10-CM | POA: Diagnosis not present

## 2022-10-08 DIAGNOSIS — E7849 Other hyperlipidemia: Secondary | ICD-10-CM | POA: Diagnosis not present

## 2022-10-08 DIAGNOSIS — I700 Atherosclerosis of aorta: Secondary | ICD-10-CM | POA: Diagnosis not present

## 2022-10-08 DIAGNOSIS — I10 Essential (primary) hypertension: Secondary | ICD-10-CM | POA: Diagnosis not present

## 2022-10-09 ENCOUNTER — Encounter: Payer: Self-pay | Admitting: Internal Medicine

## 2022-10-09 ENCOUNTER — Ambulatory Visit: Payer: Medicare Other | Attending: Internal Medicine | Admitting: Internal Medicine

## 2022-10-09 ENCOUNTER — Ambulatory Visit: Payer: Medicare Other | Admitting: Internal Medicine

## 2022-10-09 VITALS — BP 130/76 | HR 60 | Ht 70.0 in | Wt 266.0 lb

## 2022-10-09 DIAGNOSIS — Z8679 Personal history of other diseases of the circulatory system: Secondary | ICD-10-CM

## 2022-10-09 DIAGNOSIS — I2510 Atherosclerotic heart disease of native coronary artery without angina pectoris: Secondary | ICD-10-CM

## 2022-10-09 DIAGNOSIS — R0609 Other forms of dyspnea: Secondary | ICD-10-CM

## 2022-10-09 DIAGNOSIS — E7849 Other hyperlipidemia: Secondary | ICD-10-CM | POA: Diagnosis not present

## 2022-10-09 DIAGNOSIS — G4733 Obstructive sleep apnea (adult) (pediatric): Secondary | ICD-10-CM | POA: Diagnosis not present

## 2022-10-09 DIAGNOSIS — R0602 Shortness of breath: Secondary | ICD-10-CM | POA: Diagnosis not present

## 2022-10-09 NOTE — Progress Notes (Unsigned)
Cardiology Office Note  Date: 10/09/2022   ID: Robert Bailey, DOB 08/15/1950, MRN [id number]  PCP:  Toma Deiters, MD  Cardiologist:  Marjo Bicker, MD Electrophysiologist:  None   History of Present Illness: Robert Bailey is a 72 y.o. male  Past Medical History:  Diagnosis Date   Arthritis    Hypertension    Myocardial infarction [facility])     Past Surgical History:  Procedure Laterality Date   BIOPSY  04/01/2020   Procedure: BIOPSY;  Surgeon: Dolores Frame, MD;  Location: [facility];  Service: Gastroenterology;;   COLONOSCOPY     COLONOSCOPY WITH PROPOFOL N/A 04/01/2020   Procedure: COLONOSCOPY WITH PROPOFOL;  Surgeon: Dolores Frame, MD;  Location: [facility];  Service: Gastroenterology;  Laterality: N/A;  AM   POLYPECTOMY  04/01/2020   Procedure: POLYPECTOMY;  Surgeon: Marguerita Merles, Reuel Boom, MD;  Location: [facility];  Service: Gastroenterology;;   REPLACEMENT TOTAL KNEE Left 2013    Current Outpatient Medications  Medication Sig Dispense Refill   acetaminophen (TYLENOL) 325 MG tablet Take 2 tablets (650 mg total) by mouth every 6 (six) hours as needed for mild pain or headache (back pain).     amLODipine (NORVASC) 5 MG tablet Take 5 mg by mouth daily.     aspirin 81 MG tablet Take 81 mg by mouth daily.     benazepril (LOTENSIN) 40 MG tablet Take 40 mg by mouth daily.     Cholecalciferol 25 MCG (1000 UT) capsule Take 1,000 capsules by mouth daily.     docusate sodium (COLACE) 100 MG capsule Take 100 mg by mouth daily as needed for moderate constipation.     furosemide (LASIX) 20 MG tablet Take 20 mg by mouth daily.     metoprolol tartrate (LOPRESSOR) 50 MG tablet Take 1&1/2 tablets ( 75 mg ) twice a day (Patient taking differently: Take 75 mg by mouth 2 (two) times daily.) 90 tablet 1   Omega-3 Fatty Acids (FISH OIL) 1000 MG CAPS Take 1,000 mg by mouth daily.     pantoprazole (PROTONIX) 20 MG tablet Take 20 mg by mouth daily.      pravastatin (PRAVACHOL) 40 MG tablet Take 40 mg by mouth daily.     No current facility-administered medications for this visit.   Allergies:  Penicillins   Social History: The patient  reports that he has quit smoking. His smoking use included cigarettes and e-cigarettes. He has never used smokeless tobacco. He reports that he does not drink alcohol and does not use drugs.   Family History: The patient's family history includes Heart attack (age of onset: 71) in his brother; Hypertension in his mother.   ROS:  Please see the history of present illness. Otherwise, complete review of systems is positive for none.  All other systems are reviewed and negative.   Physical Exam: VS:  BP 130/76   Pulse 60   Ht 5\' 10"  (1.778 m)   Wt 266 lb (120.7 kg)   SpO2 97%   BMI 38.17 kg/m , BMI Body mass index is 38.17 kg/m.  Wt Readings from Last 3 Encounters:  10/09/22 266 lb (120.7 kg)  04/01/20 250 lb (113.4 kg)  03/30/20 250 lb (113.4 kg)    General: Patient appears comfortable at rest. HEENT: Conjunctiva and lids normal, oropharynx clear with moist mucosa. Neck: Supple, no elevated JVP or carotid bruits, no thyromegaly. Lungs: Clear to auscultation, nonlabored breathing at rest. Cardiac: Regular rate and  rhythm, no S3 or significant systolic murmur, no pericardial rub. Abdomen: Soft, nontender, no hepatomegaly, bowel sounds present, no guarding or rebound. Extremities: No pitting edema, distal pulses 2+. Skin: Warm and dry. Musculoskeletal: No kyphosis. Neuropsychiatric: Alert and oriented x3, affect grossly appropriate.  Recent Labwork: No results found for requested labs within last 365 days.     Component Value Date/Time   CHOL 161 10/12/2015 0922   TRIG 172 (H) 10/12/2015 0922   HDL 26 (L) 10/12/2015 0922   CHOLHDL 6.2 (H) 10/12/2015 0922   VLDL 34 (H) 10/12/2015 0922   LDLCALC 101 10/12/2015 0922     Assessment and Plan:  1.    Medication Adjustments/Labs and  Tests Ordered: Current medicines are reviewed at length with the patient today.  Concerns regarding medicines are outlined above.    Disposition:  Follow up   Signed, Aniyiah Zell Verne Spurr, MD, 10/09/2022 3:52 PM    [facility] at [facility] [address] [place], [place] [address]

## 2022-10-09 NOTE — Patient Instructions (Signed)
Medication Instructions:  Your physician recommends that you continue on your current medications as directed. Please refer to the Current Medication list given to you today.  *If you need a refill on your cardiac medications before your next appointment, please call your pharmacy*   Lab Work: None If you have labs (blood work) drawn today and your tests are completely normal, you will receive your results only by: MyChart Message (if you have MyChart) OR A paper copy in the mail If you have any lab test that is abnormal or we need to change your treatment, we will call you to review the results.   Testing/Procedures: Your physician has requested that you have an echocardiogram. Echocardiography is a painless test that uses sound waves to create images of your heart. It provides your doctor with information about the size and shape of your heart and how well your heart's chambers and valves are working. This procedure takes approximately one hour. There are no restrictions for this procedure. Please do NOT wear cologne, perfume, aftershave, or lotions (deodorant is allowed). Please arrive 15 minutes prior to your appointment time.  Your physician has recommended that you have a pulmonary function test. Pulmonary Function Tests are a group of tests that measure how well air moves in and out of your lungs.    Follow-Up: At [facility], you and your health needs are our priority.  As part of our continuing mission to provide you with exceptional heart care, we have created designated Provider Care Teams.  These Care Teams include your primary Cardiologist (physician) and Advanced Practice Providers (APPs -  Physician Assistants and Nurse Practitioners) who all work together to provide you with the care you need, when you need it.  We recommend signing up for the patient portal called "MyChart".  Sign up information is provided on this After Visit Summary.  MyChart is used to connect  with patients for Virtual Visits (Telemedicine).  Patients are able to view lab/test results, encounter notes, upcoming appointments, etc.  Non-urgent messages can be sent to your provider as well.   To learn more about what you can do with MyChart, go to [email or web address].    Your next appointment:   1 month(s)  Provider:   You may see Vishnu P Mallipeddi, MD or one of the following Advanced Practice Providers on your designated Care Team:   [place], PA-C  Jacolyn Reedy, [place]     Other Instructions

## 2022-10-09 NOTE — Progress Notes (Unsigned)
Sleep Apnea Evaluation   [facility]  Today's Date: 10/09/2022   Patient Name: Robert Bailey        DOB: 1950-02-15       Height:  5\' 10"  (1.778 m)     Weight: 266 lb (120.7 kg)  BMI: Body mass index is 38.17 kg/m.    Referring Provider:  Luane School, MD   STOP-BANG RISK ASSESSMENT  { Click here for STOP-BANG Calculator   :1}       If STOP-BANG Score >=3 OR two clinical symptoms - patient qualifies for WatchPAT (CPT 95800)      Sleep study ordered due to two (2) of the following clinical symptoms/diagnoses:  Excessive daytime sleepiness G47.10  Gastroesophageal reflux K21.9  Nocturia R35.1  Morning Headaches G44.221  Difficulty concentrating R41.840  Memory problems or poor judgment G31.84  Personality changes or irritability R45.4  Loud snoring R06.83  Depression F32.9  Unrefreshed by sleep G47.8  Impotence N52.9  History of high blood pressure R03.0  Insomnia G47.00  Sleep Disordered Breathing or Sleep Apnea ICD G47.33

## 2022-10-11 DIAGNOSIS — I2510 Atherosclerotic heart disease of native coronary artery without angina pectoris: Secondary | ICD-10-CM | POA: Insufficient documentation

## 2022-10-11 DIAGNOSIS — E785 Hyperlipidemia, unspecified: Secondary | ICD-10-CM | POA: Insufficient documentation

## 2022-10-11 DIAGNOSIS — Z8679 Personal history of other diseases of the circulatory system: Secondary | ICD-10-CM | POA: Insufficient documentation

## 2022-10-11 DIAGNOSIS — R0609 Other forms of dyspnea: Secondary | ICD-10-CM | POA: Insufficient documentation

## 2022-10-30 DIAGNOSIS — I700 Atherosclerosis of aorta: Secondary | ICD-10-CM | POA: Diagnosis not present

## 2022-10-30 DIAGNOSIS — Z0000 Encounter for general adult medical examination without abnormal findings: Secondary | ICD-10-CM | POA: Diagnosis not present

## 2022-10-30 DIAGNOSIS — N1831 Chronic kidney disease, stage 3a: Secondary | ICD-10-CM | POA: Diagnosis not present

## 2022-10-30 DIAGNOSIS — I5032 Chronic diastolic (congestive) heart failure: Secondary | ICD-10-CM | POA: Diagnosis not present

## 2022-10-30 DIAGNOSIS — E7849 Other hyperlipidemia: Secondary | ICD-10-CM | POA: Diagnosis not present

## 2022-10-30 DIAGNOSIS — I10 Essential (primary) hypertension: Secondary | ICD-10-CM | POA: Diagnosis not present

## 2022-11-06 ENCOUNTER — Ambulatory Visit (HOSPITAL_COMMUNITY): Admission: RE | Admit: 2022-11-06 | Payer: Medicare Other | Source: Ambulatory Visit

## 2022-11-07 ENCOUNTER — Telehealth: Payer: Self-pay

## 2022-11-07 NOTE — Telephone Encounter (Signed)
**Note De-Identified Jayelyn Barno Obfuscation** Ordering provider: Dr Jenene Slicker Associated diagnoses: DOE-R06.09 WatchPAT PA obtained on 11/07/2022 by Donyel Nester, Lorelle Formosa, LPN. Notification/Prior Authorization not required for this service CPT Code: 62130 ([place]-HST) per the [facility] Provider Portal. Patient not notified of PIN (1234) on 11/07/2022 as I got no answer so I left a message on his VM asking him to call Larita Fife back at Dr [facility] office at [phone].  Phone note routed to covering staff for follow-up.  Instructions for covering staff:  Please contact patient in 2 weeks if WatchPAT study results are not available yet. Remind patient to complete test.  If patient declines to proceed with test, please confirm that box is unopened and remind patient to return it to the office within 30 days. Route phone note to [facility] SLEEP STUDIES pool for tracking.  If box has been opened, please route phone note to Erskine Squibb (billing department).

## 2022-11-08 DIAGNOSIS — I5032 Chronic diastolic (congestive) heart failure: Secondary | ICD-10-CM | POA: Diagnosis not present

## 2022-11-08 DIAGNOSIS — N1831 Chronic kidney disease, stage 3a: Secondary | ICD-10-CM | POA: Diagnosis not present

## 2022-11-08 DIAGNOSIS — I700 Atherosclerosis of aorta: Secondary | ICD-10-CM | POA: Diagnosis not present

## 2022-11-08 DIAGNOSIS — I10 Essential (primary) hypertension: Secondary | ICD-10-CM | POA: Diagnosis not present

## 2022-11-08 DIAGNOSIS — E7849 Other hyperlipidemia: Secondary | ICD-10-CM | POA: Diagnosis not present

## 2022-11-08 NOTE — Telephone Encounter (Signed)
**Note De-Identified Robert Bailey Obfuscation** 4. Patient notified of PIN (1234) on 11/08/2022 Marlaine Arey phone call. I did request that the pt do his HST within 2 weeks from todays date, if possible and he stated that he will.  Phone note routed to covering staff for follow-up.

## 2022-11-09 ENCOUNTER — Ambulatory Visit (HOSPITAL_COMMUNITY)
Admission: RE | Admit: 2022-11-09 | Discharge: 2022-11-09 | Disposition: A | Discharge status: Home / Self Care | Payer: Medicare Other | Source: Ambulatory Visit | Attending: Internal Medicine | Admitting: Internal Medicine

## 2022-11-09 DIAGNOSIS — R0602 Shortness of breath: Secondary | ICD-10-CM | POA: Diagnosis not present

## 2022-11-09 LAB — ECHOCARDIOGRAM COMPLETE
AR max vel: 2.53 cm2
AV Area VTI: 2.41 cm2
AV Area mean vel: 2.39 cm2
AV Mean grad: 2 mm[Hg]
AV Peak grad: 4.4 mm[Hg]
Ao pk vel: 1.05 m/s
Calc EF: 62.6 %
MV VTI: 1.64 cm2
S' Lateral: 2.3 cm
Single Plane A2C EF: 65.7 %
Single Plane A4C EF: 62.4 %

## 2022-11-22 NOTE — Telephone Encounter (Signed)
Left a message for patient to call office back regarding [place] sleep study.    Sleep study has not been completed as of 11/22/22.

## 2022-11-23 ENCOUNTER — Ambulatory Visit: Payer: Medicare Other | Admitting: Internal Medicine

## 2022-11-23 NOTE — Telephone Encounter (Signed)
Pt stated that he has had the flu for the last couple of days. Patient stated that he will come to [place] office Monday/Tuesday for help downloading the correct WatchPAT application to his phone as the application he was trying to download was trying to charge him $240-350. Advised pt to not download app, office will help him Monday/Tuesday.

## 2022-11-28 NOTE — Telephone Encounter (Signed)
Attempted to contact pt with no success. Pt did not show up to office for help with downloading correct application for WatchPat One.

## 2022-12-10 DIAGNOSIS — I5032 Chronic diastolic (congestive) heart failure: Secondary | ICD-10-CM | POA: Diagnosis not present

## 2022-12-10 DIAGNOSIS — I10 Essential (primary) hypertension: Secondary | ICD-10-CM | POA: Diagnosis not present

## 2022-12-10 DIAGNOSIS — I700 Atherosclerosis of aorta: Secondary | ICD-10-CM | POA: Diagnosis not present

## 2022-12-10 DIAGNOSIS — N1831 Chronic kidney disease, stage 3a: Secondary | ICD-10-CM | POA: Diagnosis not present

## 2022-12-10 DIAGNOSIS — E7849 Other hyperlipidemia: Secondary | ICD-10-CM | POA: Diagnosis not present

## 2023-02-14 DIAGNOSIS — J440 Chronic obstructive pulmonary disease with acute lower respiratory infection: Secondary | ICD-10-CM | POA: Diagnosis not present

## 2023-02-14 DIAGNOSIS — I5032 Chronic diastolic (congestive) heart failure: Secondary | ICD-10-CM | POA: Diagnosis not present

## 2023-02-14 DIAGNOSIS — Z0000 Encounter for general adult medical examination without abnormal findings: Secondary | ICD-10-CM | POA: Diagnosis not present

## 2023-02-14 DIAGNOSIS — E7849 Other hyperlipidemia: Secondary | ICD-10-CM | POA: Diagnosis not present

## 2023-02-14 DIAGNOSIS — I700 Atherosclerosis of aorta: Secondary | ICD-10-CM | POA: Diagnosis not present

## 2023-02-14 DIAGNOSIS — I10 Essential (primary) hypertension: Secondary | ICD-10-CM | POA: Diagnosis not present

## 2023-02-14 DIAGNOSIS — N1831 Chronic kidney disease, stage 3a: Secondary | ICD-10-CM | POA: Diagnosis not present

## 2023-03-01 ENCOUNTER — Encounter (INDEPENDENT_AMBULATORY_CARE_PROVIDER_SITE_OTHER): Payer: Self-pay | Admitting: *Deleted

## 2023-03-14 DIAGNOSIS — E7849 Other hyperlipidemia: Secondary | ICD-10-CM | POA: Diagnosis not present

## 2023-03-14 DIAGNOSIS — I700 Atherosclerosis of aorta: Secondary | ICD-10-CM | POA: Diagnosis not present

## 2023-03-14 DIAGNOSIS — J449 Chronic obstructive pulmonary disease, unspecified: Secondary | ICD-10-CM | POA: Diagnosis not present

## 2023-03-14 DIAGNOSIS — I5032 Chronic diastolic (congestive) heart failure: Secondary | ICD-10-CM | POA: Diagnosis not present

## 2023-03-14 DIAGNOSIS — I10 Essential (primary) hypertension: Secondary | ICD-10-CM | POA: Diagnosis not present

## 2023-03-14 DIAGNOSIS — N1831 Chronic kidney disease, stage 3a: Secondary | ICD-10-CM | POA: Diagnosis not present

## 2023-06-06 DIAGNOSIS — I10 Essential (primary) hypertension: Secondary | ICD-10-CM | POA: Diagnosis not present

## 2023-06-06 DIAGNOSIS — J449 Chronic obstructive pulmonary disease, unspecified: Secondary | ICD-10-CM | POA: Diagnosis not present

## 2023-09-02 DIAGNOSIS — N1831 Chronic kidney disease, stage 3a: Secondary | ICD-10-CM | POA: Diagnosis not present

## 2023-09-02 DIAGNOSIS — I700 Atherosclerosis of aorta: Secondary | ICD-10-CM | POA: Diagnosis not present

## 2023-09-02 DIAGNOSIS — E7849 Other hyperlipidemia: Secondary | ICD-10-CM | POA: Diagnosis not present

## 2023-09-02 DIAGNOSIS — I5032 Chronic diastolic (congestive) heart failure: Secondary | ICD-10-CM | POA: Diagnosis not present

## 2023-09-02 DIAGNOSIS — J449 Chronic obstructive pulmonary disease, unspecified: Secondary | ICD-10-CM | POA: Diagnosis not present

## 2023-09-02 DIAGNOSIS — Z0000 Encounter for general adult medical examination without abnormal findings: Secondary | ICD-10-CM | POA: Diagnosis not present

## 2023-09-02 DIAGNOSIS — I10 Essential (primary) hypertension: Secondary | ICD-10-CM | POA: Diagnosis not present
